# Patient Record
Sex: Female | Born: 1960 | Race: Black or African American | Hispanic: No | Smoking: Never smoker
Health system: Southern US, Community
[De-identification: ages and names within clinical notes are randomized; demographics above are authoritative.]

## PROBLEM LIST (undated history)

## (undated) DIAGNOSIS — I10 Essential (primary) hypertension: Secondary | ICD-10-CM

## (undated) DIAGNOSIS — E876 Hypokalemia: Secondary | ICD-10-CM

## (undated) DIAGNOSIS — N189 Chronic kidney disease, unspecified: Secondary | ICD-10-CM

## (undated) DIAGNOSIS — N022 Recurrent and persistent hematuria with diffuse membranous glomerulonephritis: Secondary | ICD-10-CM

## (undated) DIAGNOSIS — E119 Type 2 diabetes mellitus without complications: Secondary | ICD-10-CM

## (undated) DIAGNOSIS — R809 Proteinuria, unspecified: Secondary | ICD-10-CM

## (undated) HISTORY — PX: BREAST LUMPECTOMY: SHX2

## (undated) HISTORY — PX: UTERINE FIBROID SURGERY: SHX826

## (undated) HISTORY — PX: BREAST REDUCTION SURGERY: SHX8

## (undated) HISTORY — DX: Recurrent and persistent hematuria with diffuse membranous glomerulonephritis: N02.2

## (undated) HISTORY — PX: ABDOMINAL HYSTERECTOMY: SHX81

## (undated) HISTORY — PX: REDUCTION MAMMAPLASTY: SUR839

---

## 2005-07-12 ENCOUNTER — Inpatient Hospital Stay (HOSPITAL_COMMUNITY): Admission: RE | Admit: 2005-07-12 | Discharge: 2005-07-14 | Payer: Self-pay | Admitting: Obstetrics and Gynecology

## 2005-09-06 ENCOUNTER — Encounter: Admission: RE | Admit: 2005-09-06 | Discharge: 2005-09-06 | Payer: Self-pay | Admitting: Pediatrics

## 2010-06-02 ENCOUNTER — Encounter: Payer: 59 | Attending: Family Medicine | Admitting: *Deleted

## 2010-06-02 DIAGNOSIS — R7309 Other abnormal glucose: Secondary | ICD-10-CM | POA: Insufficient documentation

## 2010-06-02 DIAGNOSIS — Z713 Dietary counseling and surveillance: Secondary | ICD-10-CM | POA: Insufficient documentation

## 2010-06-22 ENCOUNTER — Other Ambulatory Visit (HOSPITAL_COMMUNITY): Payer: Self-pay | Admitting: Nephrology

## 2010-06-22 DIAGNOSIS — R801 Persistent proteinuria, unspecified: Secondary | ICD-10-CM

## 2010-06-22 DIAGNOSIS — R319 Hematuria, unspecified: Secondary | ICD-10-CM

## 2010-06-30 ENCOUNTER — Ambulatory Visit: Payer: 59 | Admitting: *Deleted

## 2010-07-01 ENCOUNTER — Other Ambulatory Visit: Payer: Self-pay | Admitting: Interventional Radiology

## 2010-07-01 ENCOUNTER — Ambulatory Visit (HOSPITAL_COMMUNITY)
Admission: RE | Admit: 2010-07-01 | Discharge: 2010-07-01 | Disposition: A | Discharge status: Home / Self Care | Payer: 59 | Source: Ambulatory Visit | Attending: Nephrology | Admitting: Nephrology

## 2010-07-01 DIAGNOSIS — R319 Hematuria, unspecified: Secondary | ICD-10-CM

## 2010-07-01 DIAGNOSIS — N052 Unspecified nephritic syndrome with diffuse membranous glomerulonephritis: Secondary | ICD-10-CM | POA: Insufficient documentation

## 2010-07-01 DIAGNOSIS — R801 Persistent proteinuria, unspecified: Secondary | ICD-10-CM

## 2010-07-01 LAB — PROTIME-INR
INR: 0.93 (ref 0.00–1.49)
Prothrombin Time: 12.7 seconds (ref 11.6–15.2)

## 2010-07-01 LAB — CBC
HCT: 32 % — ABNORMAL LOW (ref 36.0–46.0)
Hemoglobin: 11.4 g/dL — ABNORMAL LOW (ref 12.0–15.0)
RBC: 3.82 MIL/uL — ABNORMAL LOW (ref 3.87–5.11)
RDW: 12.4 % (ref 11.5–15.5)

## 2010-07-01 LAB — GLUCOSE, CAPILLARY: Glucose-Capillary: 85 mg/dL (ref 70–99)

## 2010-07-01 LAB — APTT: aPTT: 28 seconds (ref 24–37)

## 2010-07-16 ENCOUNTER — Other Ambulatory Visit: Payer: Self-pay | Admitting: Obstetrics and Gynecology

## 2010-07-16 DIAGNOSIS — Z1231 Encounter for screening mammogram for malignant neoplasm of breast: Secondary | ICD-10-CM

## 2010-07-20 ENCOUNTER — Ambulatory Visit
Admission: RE | Admit: 2010-07-20 | Discharge: 2010-07-20 | Disposition: A | Discharge status: Home / Self Care | Payer: 59 | Source: Ambulatory Visit | Attending: Obstetrics and Gynecology | Admitting: Obstetrics and Gynecology

## 2010-07-20 ENCOUNTER — Other Ambulatory Visit: Payer: Self-pay | Admitting: Obstetrics and Gynecology

## 2010-07-20 DIAGNOSIS — Z1231 Encounter for screening mammogram for malignant neoplasm of breast: Secondary | ICD-10-CM

## 2010-07-21 ENCOUNTER — Other Ambulatory Visit: Payer: Self-pay | Admitting: Nephrology

## 2010-07-21 ENCOUNTER — Ambulatory Visit
Admission: RE | Admit: 2010-07-21 | Discharge: 2010-07-21 | Disposition: A | Discharge status: Home / Self Care | Payer: 59 | Source: Ambulatory Visit | Attending: Nephrology | Admitting: Nephrology

## 2010-07-21 ENCOUNTER — Encounter: Payer: 59 | Attending: Family Medicine | Admitting: *Deleted

## 2010-07-21 DIAGNOSIS — N052 Unspecified nephritic syndrome with diffuse membranous glomerulonephritis: Secondary | ICD-10-CM

## 2010-07-21 DIAGNOSIS — Z713 Dietary counseling and surveillance: Secondary | ICD-10-CM | POA: Insufficient documentation

## 2010-07-21 DIAGNOSIS — R7309 Other abnormal glucose: Secondary | ICD-10-CM | POA: Insufficient documentation

## 2010-07-28 ENCOUNTER — Ambulatory Visit
Admission: RE | Admit: 2010-07-28 | Discharge: 2010-07-28 | Disposition: A | Discharge status: Home / Self Care | Payer: 59 | Source: Ambulatory Visit | Attending: Obstetrics and Gynecology | Admitting: Obstetrics and Gynecology

## 2010-07-28 ENCOUNTER — Ambulatory Visit: Payer: 59

## 2010-07-28 DIAGNOSIS — Z1231 Encounter for screening mammogram for malignant neoplasm of breast: Secondary | ICD-10-CM

## 2010-07-30 ENCOUNTER — Other Ambulatory Visit: Payer: Self-pay | Admitting: Obstetrics and Gynecology

## 2010-07-30 DIAGNOSIS — R928 Other abnormal and inconclusive findings on diagnostic imaging of breast: Secondary | ICD-10-CM

## 2010-08-06 ENCOUNTER — Ambulatory Visit
Admission: RE | Admit: 2010-08-06 | Discharge: 2010-08-06 | Disposition: A | Discharge status: Home / Self Care | Payer: 59 | Source: Ambulatory Visit | Attending: Obstetrics and Gynecology | Admitting: Obstetrics and Gynecology

## 2010-08-06 DIAGNOSIS — R928 Other abnormal and inconclusive findings on diagnostic imaging of breast: Secondary | ICD-10-CM

## 2010-08-09 ENCOUNTER — Other Ambulatory Visit: Payer: Self-pay | Admitting: Obstetrics and Gynecology

## 2010-08-19 ENCOUNTER — Ambulatory Visit: Payer: 59 | Admitting: *Deleted

## 2010-08-31 ENCOUNTER — Ambulatory Visit: Payer: 59 | Admitting: *Deleted

## 2010-12-29 ENCOUNTER — Ambulatory Visit
Admission: RE | Admit: 2010-12-29 | Discharge: 2010-12-29 | Disposition: A | Discharge status: Home / Self Care | Payer: 59 | Source: Ambulatory Visit | Attending: Nephrology | Admitting: Nephrology

## 2010-12-29 ENCOUNTER — Other Ambulatory Visit: Payer: Self-pay | Admitting: Nephrology

## 2010-12-29 DIAGNOSIS — M7989 Other specified soft tissue disorders: Secondary | ICD-10-CM

## 2011-03-02 ENCOUNTER — Other Ambulatory Visit (HOSPITAL_COMMUNITY): Payer: Self-pay | Admitting: *Deleted

## 2011-03-02 ENCOUNTER — Encounter (HOSPITAL_COMMUNITY)
Admission: RE | Admit: 2011-03-02 | Discharge: 2011-03-02 | Disposition: A | Discharge status: Home / Self Care | Payer: 59 | Source: Ambulatory Visit | Attending: Nephrology | Admitting: Nephrology

## 2011-03-02 DIAGNOSIS — N052 Unspecified nephritic syndrome with diffuse membranous glomerulonephritis: Secondary | ICD-10-CM | POA: Insufficient documentation

## 2011-03-02 MED ORDER — SODIUM CHLORIDE 0.9 % IV SOLN
500.0000 mg | Freq: Every day | INTRAVENOUS | Status: DC
  Administered 2011-03-02: 500 mg via INTRAVENOUS
  Filled 2011-03-02: qty 4

## 2011-03-03 ENCOUNTER — Encounter (HOSPITAL_COMMUNITY)
Admission: RE | Admit: 2011-03-03 | Discharge: 2011-03-03 | Disposition: A | Discharge status: Home / Self Care | Payer: 59 | Source: Ambulatory Visit | Attending: Nephrology | Admitting: Nephrology

## 2011-03-03 MED ORDER — SODIUM CHLORIDE 0.9 % IV SOLN
500.0000 mg | INTRAVENOUS | Status: AC
  Administered 2011-03-03: 500 mg via INTRAVENOUS
  Filled 2011-03-03: qty 4

## 2011-03-03 MED ORDER — METHYLPREDNISOLONE SODIUM SUCC 125 MG IJ SOLR: 500.0000 mg | Freq: Once | INTRAMUSCULAR | Status: DC

## 2011-03-03 MED ORDER — METHYLPREDNISOLONE SODIUM SUCC 125 MG IJ SOLR
500.0000 mg | INTRAMUSCULAR | Status: DC
  Filled 2011-03-03: qty 8

## 2011-04-14 ENCOUNTER — Other Ambulatory Visit: Payer: Self-pay | Admitting: Nephrology

## 2011-04-14 DIAGNOSIS — IMO0002 Reserved for concepts with insufficient information to code with codable children: Secondary | ICD-10-CM

## 2011-04-25 ENCOUNTER — Other Ambulatory Visit (HOSPITAL_COMMUNITY): Payer: Self-pay | Admitting: *Deleted

## 2011-04-26 ENCOUNTER — Ambulatory Visit
Admission: RE | Admit: 2011-04-26 | Discharge: 2011-04-26 | Disposition: A | Discharge status: Home / Self Care | Payer: 59 | Source: Ambulatory Visit | Attending: Nephrology | Admitting: Nephrology

## 2011-04-26 DIAGNOSIS — IMO0002 Reserved for concepts with insufficient information to code with codable children: Secondary | ICD-10-CM

## 2011-05-02 ENCOUNTER — Encounter (HOSPITAL_COMMUNITY)
Admission: RE | Admit: 2011-05-02 | Discharge: 2011-05-02 | Disposition: A | Discharge status: Home / Self Care | Payer: 59 | Source: Ambulatory Visit | Attending: Nephrology | Admitting: Nephrology

## 2011-05-02 DIAGNOSIS — N052 Unspecified nephritic syndrome with diffuse membranous glomerulonephritis: Secondary | ICD-10-CM | POA: Insufficient documentation

## 2011-05-02 MED ORDER — SODIUM CHLORIDE 0.9 % IV SOLN
500.0000 mg | INTRAVENOUS | Status: DC
  Administered 2011-05-02: 500 mg via INTRAVENOUS
  Filled 2011-05-02: qty 4

## 2011-05-02 MED ORDER — SODIUM CHLORIDE 0.9 % IV SOLN
INTRAVENOUS | Status: DC
  Administered 2011-05-02: 13:00:00 via INTRAVENOUS

## 2011-05-03 ENCOUNTER — Encounter (HOSPITAL_COMMUNITY)
Admission: RE | Admit: 2011-05-03 | Discharge: 2011-05-03 | Disposition: A | Discharge status: Home / Self Care | Payer: 59 | Source: Ambulatory Visit | Attending: Nephrology | Admitting: Nephrology

## 2011-05-03 MED ORDER — SODIUM CHLORIDE 0.9 % IV SOLN
500.0000 mg | INTRAVENOUS | Status: AC
  Administered 2011-05-03: 500 mg via INTRAVENOUS
  Filled 2011-05-03: qty 4

## 2011-05-03 MED ORDER — SODIUM CHLORIDE 0.9 % IV SOLN
INTRAVENOUS | Status: AC
  Administered 2011-05-03: 13:00:00 via INTRAVENOUS

## 2011-06-29 ENCOUNTER — Other Ambulatory Visit (HOSPITAL_COMMUNITY): Payer: Self-pay

## 2011-06-29 ENCOUNTER — Encounter (HOSPITAL_COMMUNITY): Payer: 59

## 2011-06-30 ENCOUNTER — Encounter (HOSPITAL_COMMUNITY)
Admission: RE | Admit: 2011-06-30 | Discharge: 2011-06-30 | Disposition: A | Discharge status: Home / Self Care | Payer: 59 | Source: Ambulatory Visit | Attending: Nephrology | Admitting: Nephrology

## 2011-06-30 DIAGNOSIS — N052 Unspecified nephritic syndrome with diffuse membranous glomerulonephritis: Secondary | ICD-10-CM | POA: Insufficient documentation

## 2011-06-30 MED ORDER — METHYLPREDNISOLONE SODIUM SUCC 125 MG IJ SOLR
500.0000 mg | INTRAMUSCULAR | Status: DC
  Filled 2011-06-30: qty 8

## 2011-06-30 MED ORDER — SODIUM CHLORIDE 0.9 % IV SOLN: 500.0000 mg | INTRAVENOUS | Status: DC

## 2011-06-30 MED ORDER — SODIUM CHLORIDE 0.9 % IV SOLN
500.0000 mg | INTRAVENOUS | Status: DC
  Administered 2011-06-30: 500 mg via INTRAVENOUS
  Filled 2011-06-30: qty 4

## 2011-06-30 MED ORDER — SODIUM CHLORIDE 0.9 % IV SOLN
INTRAVENOUS | Status: DC
  Administered 2011-06-30: 250 mL via INTRAVENOUS

## 2011-07-01 ENCOUNTER — Encounter (HOSPITAL_COMMUNITY)
Admission: RE | Admit: 2011-07-01 | Discharge: 2011-07-01 | Disposition: A | Discharge status: Home / Self Care | Payer: 59 | Source: Ambulatory Visit | Attending: Nephrology | Admitting: Nephrology

## 2011-07-01 MED ORDER — SODIUM CHLORIDE 0.9 % IV SOLN
INTRAVENOUS | Status: AC
  Administered 2011-07-01: 09:00:00 via INTRAVENOUS

## 2011-07-01 MED ORDER — SODIUM CHLORIDE 0.9 % IV SOLN
500.0000 mg | Freq: Once | INTRAVENOUS | Status: AC
  Administered 2011-07-01: 500 mg via INTRAVENOUS
  Filled 2011-07-01: qty 4

## 2012-01-25 IMAGING — CR DG CHEST 2V
2 series · 2 of 2 positions shown · non-contrast
Comparison: None.

CLINICAL DATA: Membranous nephropathy,  evaluate for mass or
malignancy

CHEST - 2 VIEW

[w chest pa]
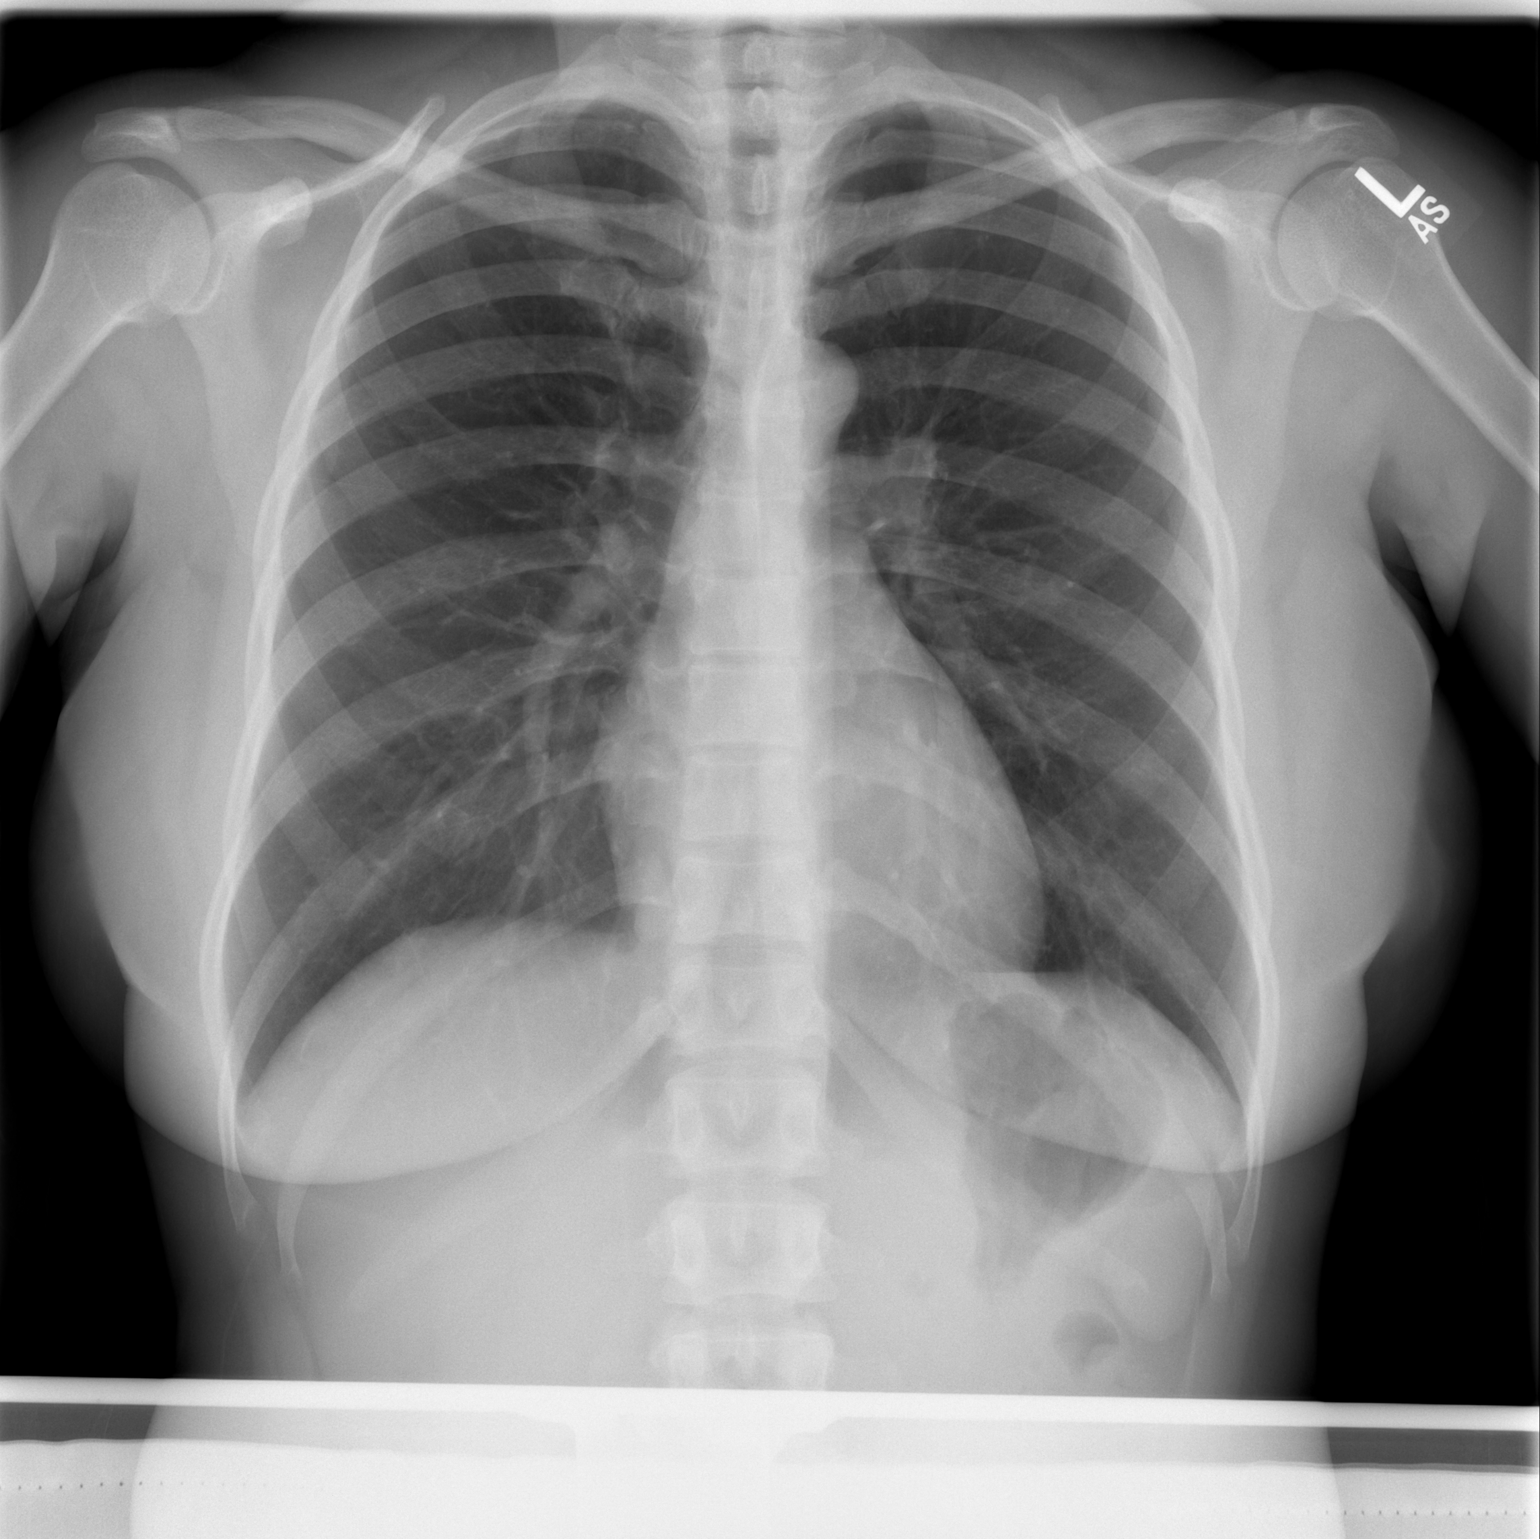

[w chest lat]
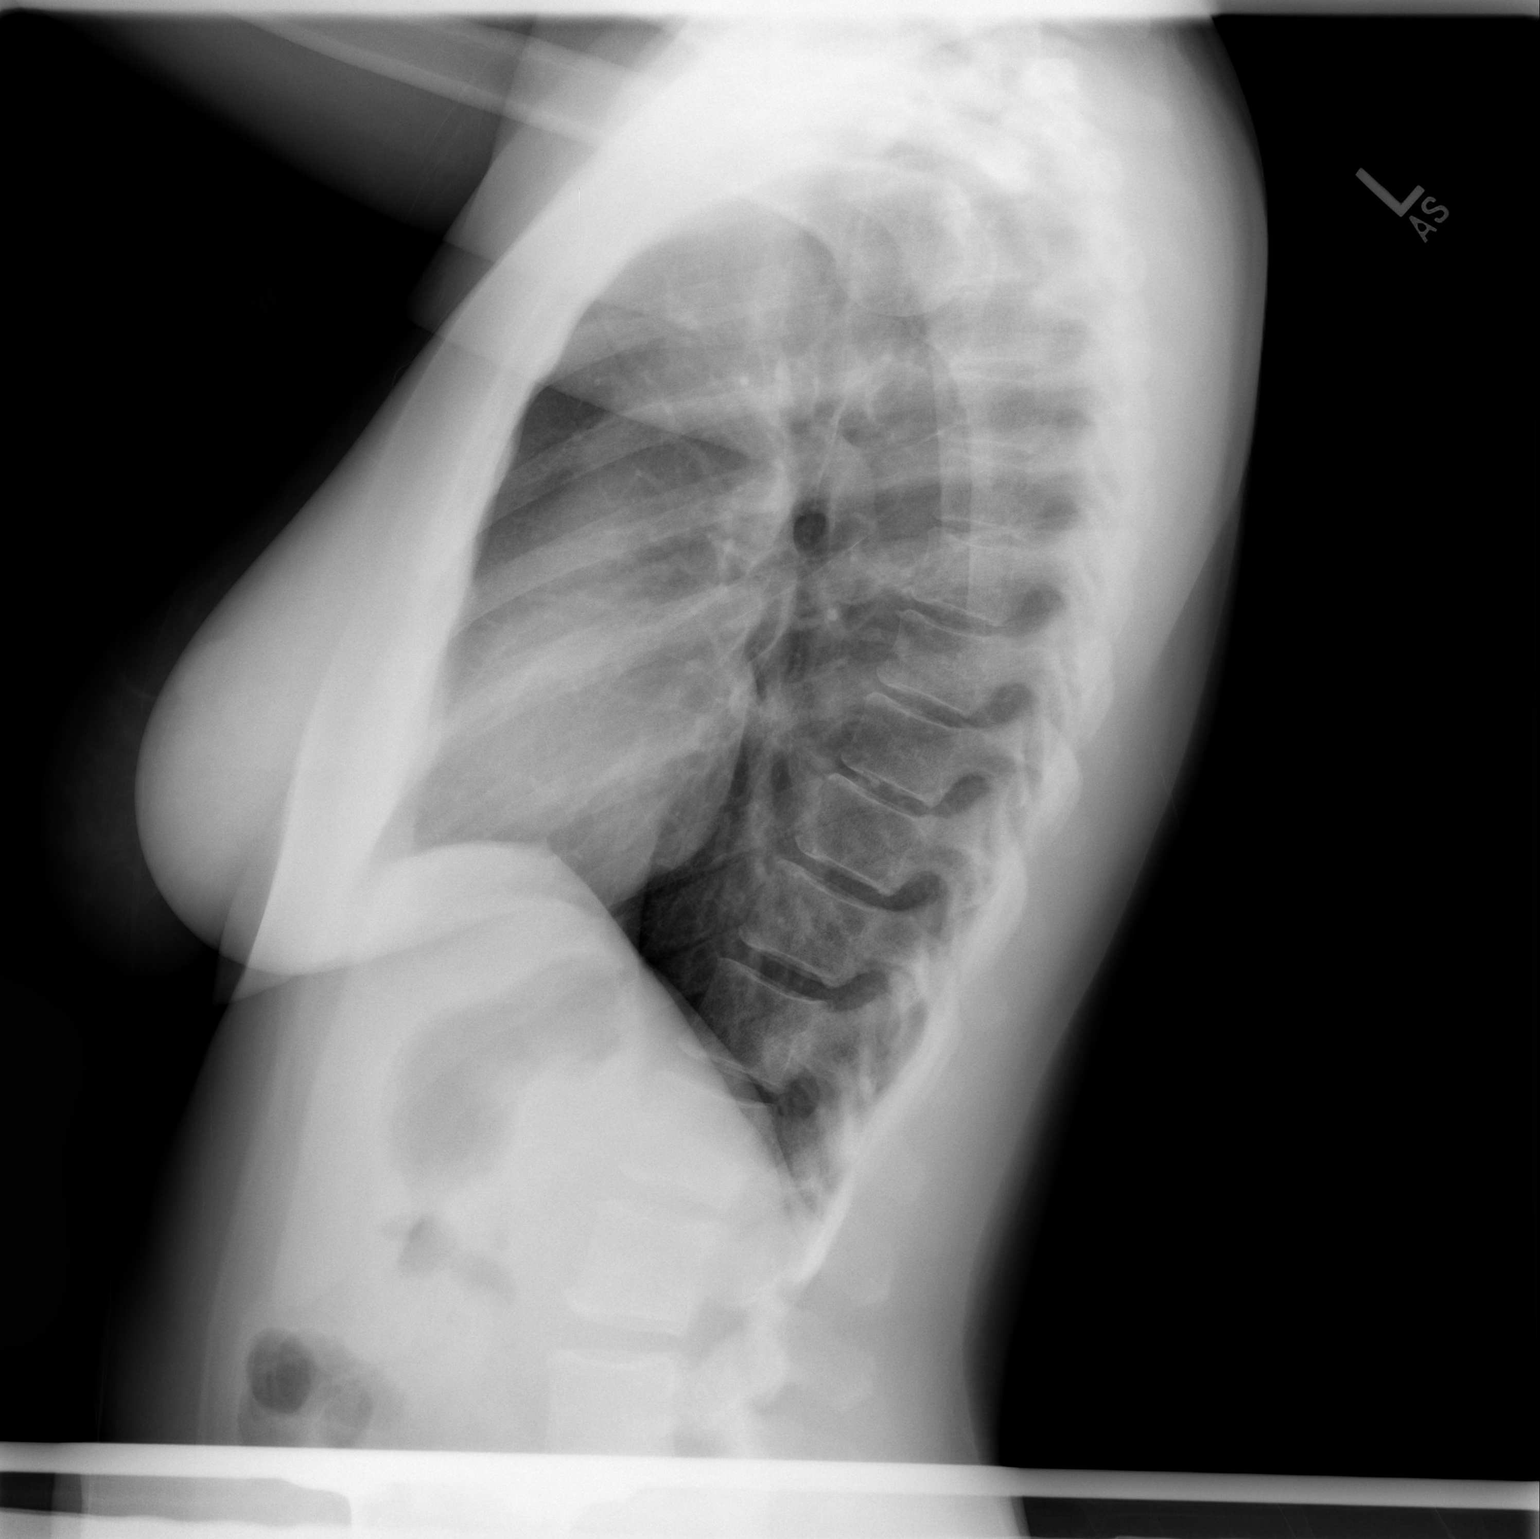

[2 of 2 positions shown; findings below may reference images not displayed]

FINDINGS: Normal mediastinum and heart silhouette.  Costophrenic
angles are clear.  No effusion, infiltrate, or pneumothorax.
IMPRESSION: Normal chest radiograph.

## 2012-02-10 IMAGING — MG MM DIAGNOSTIC LTD LEFT
3 series · 3 of 3 positions shown · non-contrast
Comparison: With prior mammograms dated 07/28/2010, 08/26/2009 and
03/04/2008

CLINICAL DATA: Abnormal left screening mammogram.  History of
reduction mammoplasty.

DIGITAL DIAGNOSTIC LEFT MAMMOGRAM

[L CC]
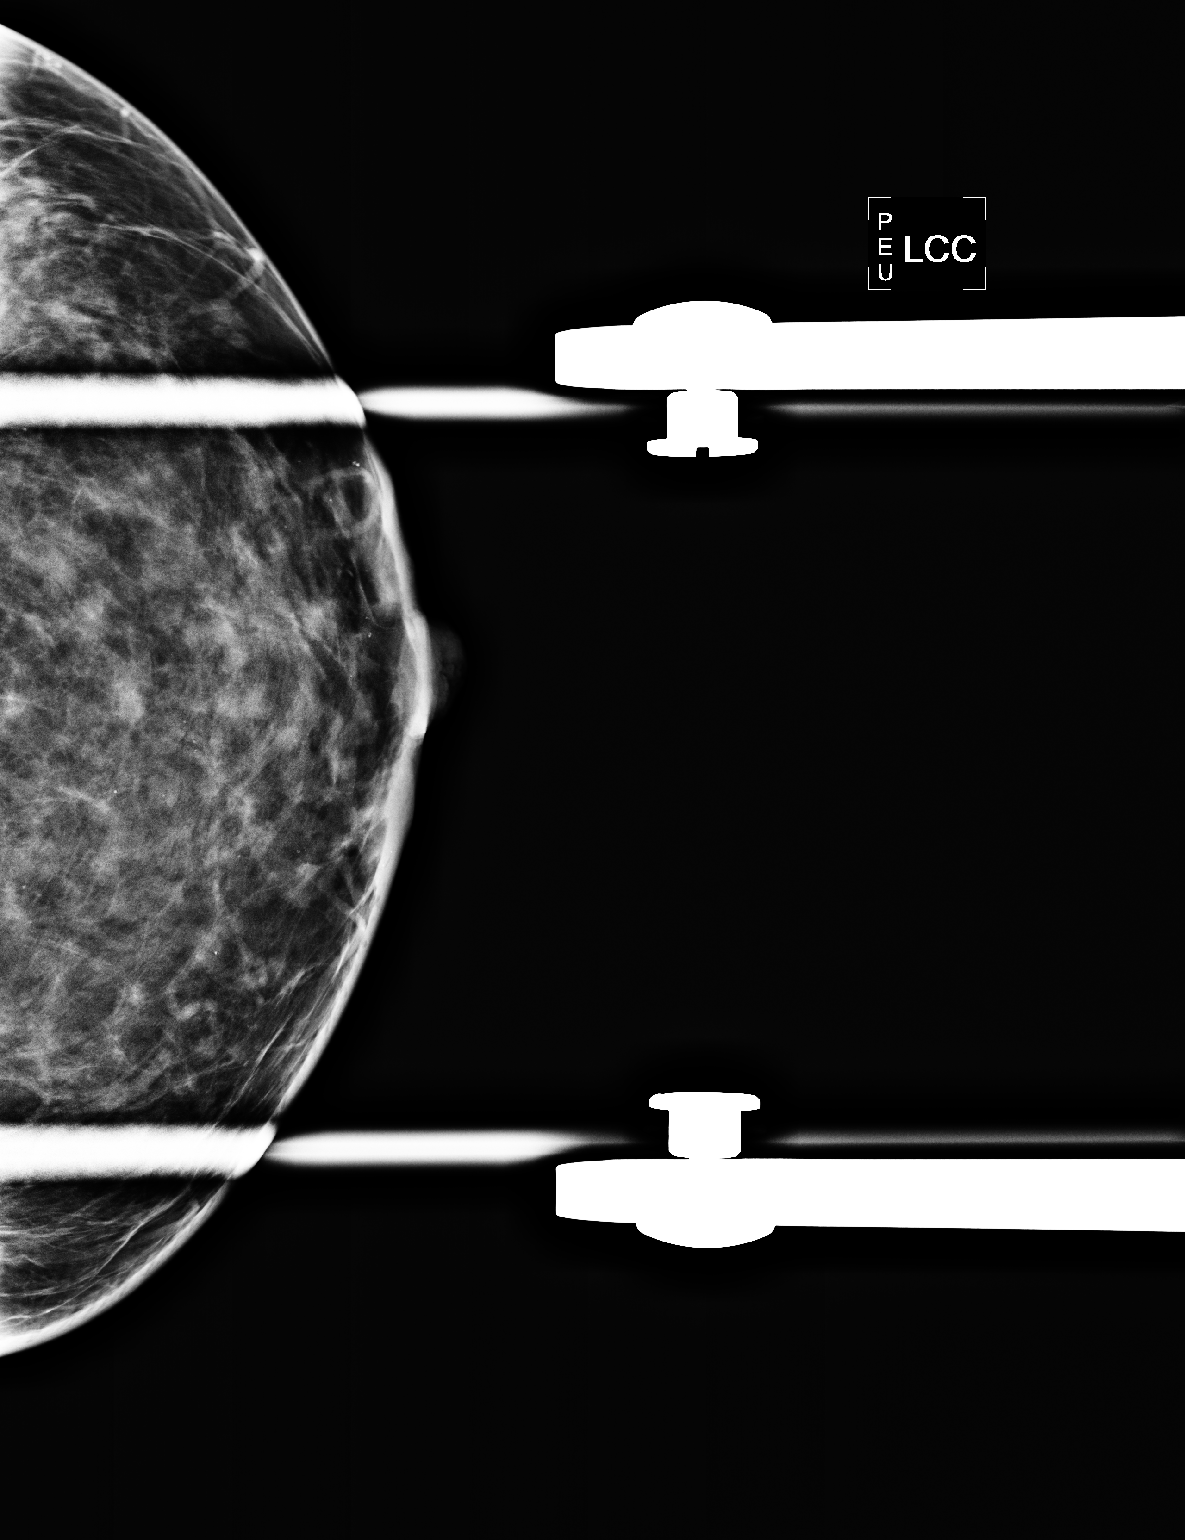

[L MLO (1 of 2)]
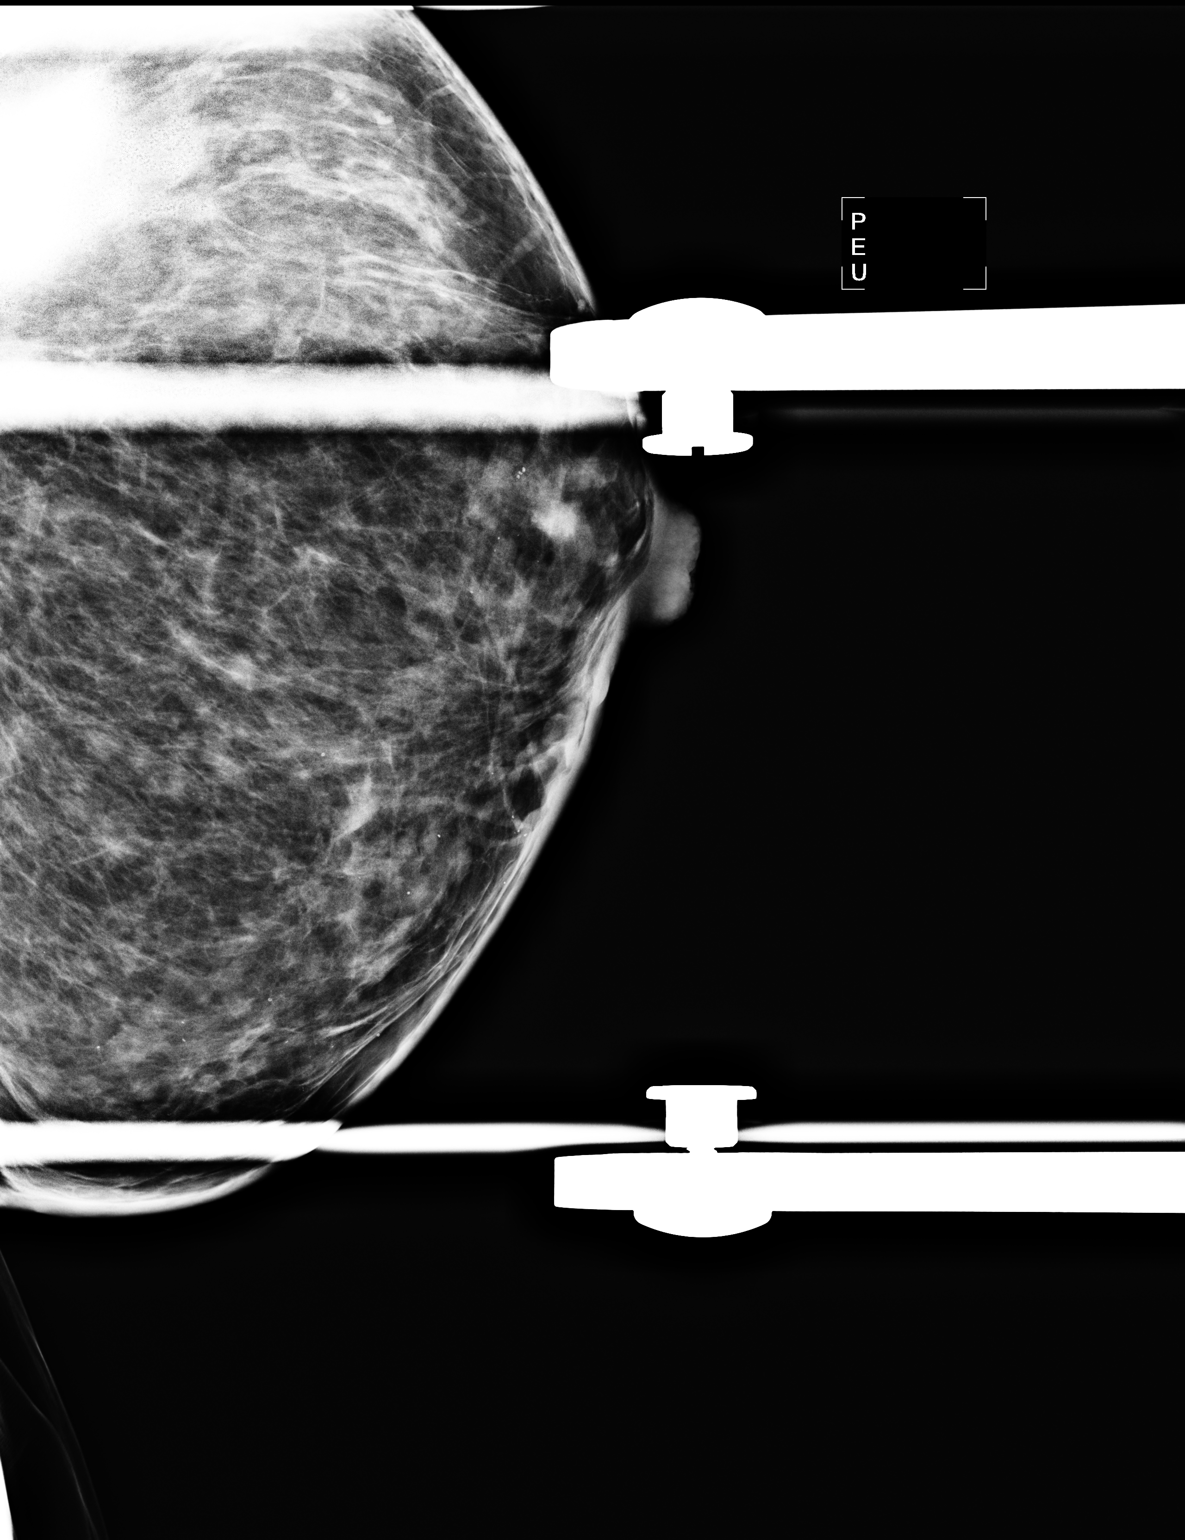

[L MLO (2 of 2)]
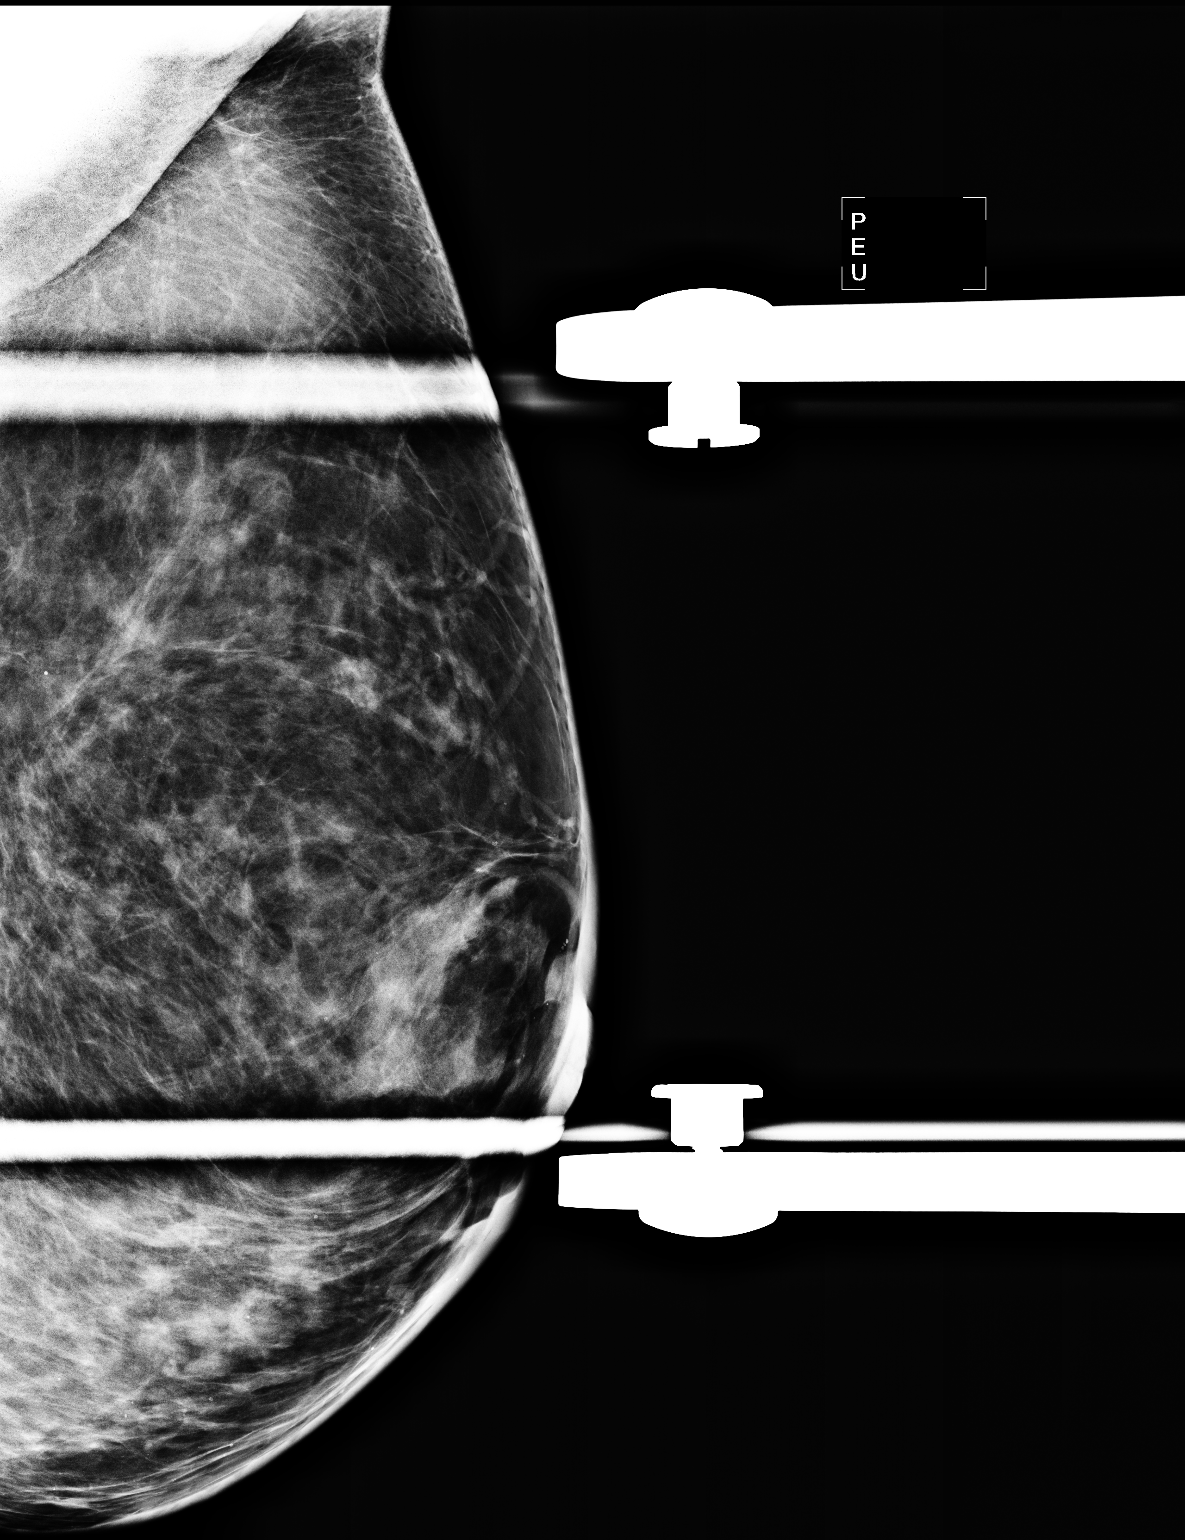

[3 of 3 positions shown; findings below may reference images not displayed]

FINDINGS: Persist asymmetric tissue is seen in the left subareolar
region.  It is unchanged from the prior exams.

On physical exam, I do not palpate a mass in the left breast.

Ultrasound is performed, showing normal tissue is seen in the left
subareolar region.
IMPRESSION: No evidence of malignancy in the left breast.  Screening mammogram
in 1 year is recommended.

BI-RADS CATEGORY 1:  Negative.

## 2012-08-03 ENCOUNTER — Telehealth: Payer: Self-pay | Admitting: Internal Medicine

## 2012-08-03 NOTE — Telephone Encounter (Signed)
C/D 08/03/12 for appt. 09/05/12

## 2012-08-03 NOTE — Telephone Encounter (Signed)
S/W PT IN RE NP APPT 07/09 @ 1:30 W/DR. MOHAMED REFERRING  DR. Gerlene Burdock FOX DX- ANEMIA WELCOME PACKET MAILED.

## 2012-08-17 ENCOUNTER — Telehealth: Payer: Self-pay | Admitting: *Deleted

## 2012-08-17 NOTE — Telephone Encounter (Signed)
Progress noted from [facility] given to Dr Donnald Garre to review.  Pt will see Dr Donnald Garre at new patient on 09/05/12.

## 2012-09-04 ENCOUNTER — Telehealth: Payer: Self-pay | Admitting: Internal Medicine

## 2012-09-04 NOTE — Telephone Encounter (Signed)
Pt called to r/s her np appt. To 09/19/12@1 :30

## 2012-09-05 ENCOUNTER — Other Ambulatory Visit: Payer: 59 | Admitting: Lab

## 2012-09-05 ENCOUNTER — Ambulatory Visit: Payer: 59 | Admitting: Internal Medicine

## 2012-09-05 ENCOUNTER — Ambulatory Visit: Payer: 59

## 2012-09-19 ENCOUNTER — Telehealth: Payer: Self-pay | Admitting: Internal Medicine

## 2012-09-19 ENCOUNTER — Ambulatory Visit: Payer: 59

## 2012-09-19 ENCOUNTER — Ambulatory Visit: Payer: 59 | Admitting: Internal Medicine

## 2012-09-19 ENCOUNTER — Other Ambulatory Visit: Payer: 59 | Admitting: Lab

## 2012-09-19 DIAGNOSIS — D539 Nutritional anemia, unspecified: Secondary | ICD-10-CM | POA: Insufficient documentation

## 2012-09-19 NOTE — Telephone Encounter (Signed)
Pt called to r/s np appt for 07/23 due to work 08/12 @ 1:30. New calendar mailed.

## 2012-09-20 NOTE — Progress Notes (Signed)
This encounter was created in error - please disregard.

## 2012-10-03 ENCOUNTER — Encounter: Payer: Self-pay | Admitting: Nephrology

## 2012-10-09 ENCOUNTER — Other Ambulatory Visit (HOSPITAL_BASED_OUTPATIENT_CLINIC_OR_DEPARTMENT_OTHER): Payer: 59 | Admitting: Lab

## 2012-10-09 ENCOUNTER — Ambulatory Visit: Payer: 59

## 2012-10-09 ENCOUNTER — Encounter: Payer: Self-pay | Admitting: Internal Medicine

## 2012-10-09 ENCOUNTER — Telehealth: Payer: Self-pay | Admitting: Internal Medicine

## 2012-10-09 ENCOUNTER — Ambulatory Visit (HOSPITAL_BASED_OUTPATIENT_CLINIC_OR_DEPARTMENT_OTHER): Payer: 59 | Admitting: Internal Medicine

## 2012-10-09 VITALS — BP 127/88 | HR 92 | Temp 98.1°F | Resp 19 | Ht 64.0 in | Wt 155.0 lb

## 2012-10-09 DIAGNOSIS — D649 Anemia, unspecified: Secondary | ICD-10-CM

## 2012-10-09 DIAGNOSIS — N052 Unspecified nephritic syndrome with diffuse membranous glomerulonephritis: Secondary | ICD-10-CM

## 2012-10-09 DIAGNOSIS — I10 Essential (primary) hypertension: Secondary | ICD-10-CM

## 2012-10-09 DIAGNOSIS — D539 Nutritional anemia, unspecified: Secondary | ICD-10-CM

## 2012-10-09 LAB — CBC & DIFF AND RETIC
BASO%: 0.3 % (ref 0.0–2.0)
Eosinophils Absolute: 0.1 10*3/uL (ref 0.0–0.5)
MONO#: 0.3 10*3/uL (ref 0.1–0.9)
NEUT#: 3.6 10*3/uL (ref 1.5–6.5)
RBC: 3.13 10*6/uL — ABNORMAL LOW (ref 3.70–5.45)
RDW: 13.6 % (ref 11.2–14.5)
Retic %: 1.32 % (ref 0.70–2.10)
Retic Ct Abs: 41.32 10*3/uL (ref 33.70–90.70)
WBC: 6.2 10*3/uL (ref 3.9–10.3)
lymph#: 2.2 10*3/uL (ref 0.9–3.3)

## 2012-10-09 LAB — IRON AND TIBC CHCC: %SAT: 30 % (ref 21–57)

## 2012-10-09 NOTE — Progress Notes (Signed)
Checked in new pt with no financial concerns. °

## 2012-10-09 NOTE — Patient Instructions (Signed)
You have persistent anemia of unknown etiology. I ordered several studies today to evaluate your anemia. Followup visit in 3 weeks

## 2012-10-09 NOTE — Telephone Encounter (Signed)
s.w. pt and advised that Dr. Kerry Fort wanted lab b4 visit...ok and awre

## 2012-10-09 NOTE — Progress Notes (Signed)
[facility] [phone]   [phone]  CONSULT NOTE  REFERRING PHYSICIAN: Dr. Marina Gravel  REASON FOR CONSULTATION:  52 years old African American female with persistent anemia  HPI Susan Haley is a 52 y.o. 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 The patient mentions that she has anemia for many years. It was initially thought to be related to menorrhagia but the patient had hysterectomy in 2008 with no improvement in her anemia . On May 31st 2012 her hemoglobin was 11.4. 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 She was started on treatment with Cytoxan and prednisone in December of 2012 until June of 2013. Her hemoglobin at that time was in the range of 9.0-9.5 g/dL. She was seen recently by Dr. Caryn Section and repeat CBC in June of 2014 showed hemoglobin of 9.1. Iron study at that time showed serum iron of 68 with total iron binding capacity of 216 and iron saturation 26%. The patient was started on treatment with Ferrex Plus 150 mg by mouth daily but she does not take it on regular basis. She is feeling fine today with no specific complaints except for mild fatigue and occasional nosebleed. The patient denied having any rectal bleeding. She never had a colonoscopy before and was not tested for Hemoccult. She has no significant family history of sickle cell disease. The patient denied having any significant chest pain, shortness breath, cough or hemoptysis. She has no weight loss but has night sweats.  PAST MEDICAL HISTORY: Hypertension, borderline hypercholesterolemia, membranous nephropathy, anemia and status post hysterectomy in 2008.  FAMILY HISTORY: Significant for her mother with anemia, father with prostate cancer and maternal aunt with breast cancer.  SOCIAL HISTORY: The patient is single and  has 2 children. She works as a Scientist, forensic. She has no history of smoking, alcohol or drug abuse.   Allergies  Allergen Reactions  . Ibuprofen Swelling    Current Outpatient Prescriptions  Medication Sig Dispense Refill  . diltiazem (TIAZAC) 300 MG 24 hr capsule Take 300 mg by mouth daily.      . furosemide (LASIX) 40 MG tablet Take 40 mg by mouth daily as needed.      . iron polysaccharides (NIFEREX) 150 MG capsule Take 150 mg by mouth daily.      Marland Kitchen lisinopril-hydrochlorothiazide (PRINZIDE,ZESTORETIC) 20-12.5 MG per tablet Take 2 tablets by mouth daily.      . potassium chloride SA (K-DUR,KLOR-CON) 20 MEQ tablet Take 20 mEq by mouth 2 (two) times daily.       No current facility-administered medications for this visit.    Review of Systems  A comprehensive review of systems was negative.  Physical Exam  JXB:JYNWG, healthy, no distress, well nourished and well developed SKIN: skin color, texture, turgor are normal HEAD: Normocephalic, No masses, lesions, tenderness or abnormalities EYES: normal, PERRLA EARS: External ears normal OROPHARYNX:no exudate and no erythema  NECK: supple, no adenopathy LYMPH:  no palpable lymphadenopathy, no hepatosplenomegaly BREAST:not examined LUNGS: clear to auscultation  HEART: regular rate & rhythm and no murmurs ABDOMEN:abdomen soft, non-tender, normal bowel sounds and no masses or organomegaly BACK: Back symmetric, no curvature. EXTREMITIES:no joint deformities, effusion, or inflammation, no edema, no skin discoloration  NEURO: alert & oriented x 3 with fluent speech, no focal motor/sensory deficits  PERFORMANCE STATUS: ECOG 1  LABORATORY DATA: Lab Results  Component Value  Date   WBC 6.2 10/09/2012   HGB 9.4* 10/09/2012   HCT 26.8* 10/09/2012   MCV 85.6 10/09/2012   PLT 189 10/09/2012      Chemistry   No results found for this basename: NA, K, CL, CO2, BUN, CREATININE, GLU   No results found for this basename: CALCIUM, ALKPHOS,  AST, ALT, BILITOT       RADIOGRAPHIC STUDIES: No results found.  ASSESSMENT: This is a very pleasant 52 years old [place] American female with persistent anemia for more than 25 years of unclear etiology at this point. This could be secondary to nutritional deficiency versus anemia of chronic disease versus myelodysplasia.   PLAN: I ordered several studies today to evaluate her anemia including repeat CBC, comprehensive metabolic panel, LDH, serum erythropoietin, serum protein electrophoreses, iron study, ferritin, serum folate and serum vitamin B12 level. I also gave the patient stool cards for Hemoccult. I would see the patient back for followup visit in 3 weeks for evaluation and discussion of her pending labs results and further recommendation regarding her anemia. For now she will continue on the oral iron tablets. She was advised to call immediately if she has any concerning symptoms in the interval. The patient voices understanding of current disease status and treatment options and is in agreement with the current care plan.  All questions were answered. The patient knows to call the clinic with any problems, questions or concerns. We can certainly see the patient much sooner if necessary.  Thank you so much for allowing me to participate in the care of Susan Haley. I will continue to follow up the patient with you and assist in her care.  I spent 35 minutes counseling the patient face to face. The total time spent in the appointment was 50 minutes.  Toinette Lackie K. 10/09/2012, 3:25 PM

## 2012-10-11 LAB — ERYTHROPOIETIN: Erythropoietin: 10.7 m[IU]/mL (ref 2.6–18.5)

## 2012-10-11 LAB — PROTEIN ELECTROPHORESIS, SERUM
Albumin ELP: 57.8 % (ref 55.8–66.1)
Alpha-2-Globulin: 8.7 % (ref 7.1–11.8)
Beta Globulin: 5.6 % (ref 4.7–7.2)
Total Protein, Serum Electrophoresis: 7.8 g/dL (ref 6.0–8.3)

## 2012-10-11 LAB — FOLATE: Folate: >20 ng/mL

## 2012-10-30 ENCOUNTER — Ambulatory Visit (HOSPITAL_BASED_OUTPATIENT_CLINIC_OR_DEPARTMENT_OTHER): Payer: 59 | Admitting: Internal Medicine

## 2012-10-30 ENCOUNTER — Other Ambulatory Visit (HOSPITAL_BASED_OUTPATIENT_CLINIC_OR_DEPARTMENT_OTHER): Payer: 59 | Admitting: Lab

## 2012-10-30 ENCOUNTER — Encounter: Payer: Self-pay | Admitting: Internal Medicine

## 2012-10-30 VITALS — BP 127/81 | HR 80 | Temp 98.7°F | Resp 18 | Ht 64.0 in | Wt 153.9 lb

## 2012-10-30 DIAGNOSIS — D539 Nutritional anemia, unspecified: Secondary | ICD-10-CM

## 2012-10-30 DIAGNOSIS — D649 Anemia, unspecified: Secondary | ICD-10-CM

## 2012-10-30 LAB — CBC WITH DIFFERENTIAL/PLATELET
Basophils Absolute: 0 10*3/uL (ref 0.0–0.1)
Eosinophils Absolute: 0.1 10*3/uL (ref 0.0–0.5)
HGB: 9.1 g/dL — ABNORMAL LOW (ref 11.6–15.9)
MCV: 87.3 fL (ref 79.5–101.0)
MONO#: 0.4 10*3/uL (ref 0.1–0.9)
MONO%: 6.2 % (ref 0.0–14.0)
NEUT#: 3.9 10*3/uL (ref 1.5–6.5)
RBC: 3.01 10*6/uL — ABNORMAL LOW (ref 3.70–5.45)
RDW: 14 % (ref 11.2–14.5)
WBC: 7 10*3/uL (ref 3.9–10.3)

## 2012-10-30 NOTE — Patient Instructions (Signed)
Followup visit in 3 months with repeat iron study

## 2012-10-30 NOTE — Progress Notes (Signed)
[facility] [phone]   [phone]  OFFICE PROGRESS NOTE  Zada Girt, MD [address] [facility] [place] [address]  DIAGNOSIS: Persistent anemia questionable for anemia of chronic disease/plus minus deficiency  PRIOR THERAPY: None  CURRENT THERAPY: Ferrex plus 150 mg by mouth daily.  INTERVAL HISTORY: Susan Haley 52 y.o. female returns to the clinic today for followup visit. The patient is feeling fine today with no specific complaints. She denied having any significant fatigue or weakness. She denied having any dizzy spells. She has no significant change since her last visit. The patient had several studies for evaluation of her anemia including iron study and ferritin that was unremarkable. The patient also has normal serum folate and slightly elevated vitamin B12 level in addition to normal serum protein electrophoreses and normal erythropoietin level. She was supposed to do stool cards for Hemoccult but she forgot. She had repeat CBC performed earlier today and she is here for evaluation and discussion of her lab results.  MEDICAL HISTORY:History reviewed. No pertinent past medical history.  ALLERGIES:  is allergic to ibuprofen.  MEDICATIONS:  Current Outpatient Prescriptions  Medication Sig Dispense Refill  . diltiazem (TIAZAC) 300 MG 24 hr capsule Take 300 mg by mouth daily.      . furosemide (LASIX) 40 MG tablet Take 40 mg by mouth daily as needed.      . iron polysaccharides (NIFEREX) 150 MG capsule Take 150 mg by mouth daily.      Marland Kitchen lisinopril-hydrochlorothiazide (PRINZIDE,ZESTORETIC) 20-12.5 MG per tablet Take 2 tablets by mouth daily.      . potassium chloride SA (K-DUR,KLOR-CON) 20 MEQ tablet Take 20 mEq by mouth 2 (two) times daily.       No current facility-administered medications for this visit.    REVIEW OF SYSTEMS:  A comprehensive review of systems was negative.   PHYSICAL EXAMINATION: General  appearance: alert, cooperative and no distress Head: Normocephalic, without obvious abnormality, atraumatic Neck: no adenopathy Lymph nodes: Cervical, supraclavicular, and axillary nodes normal. Resp: clear to auscultation bilaterally Cardio: regular rate and rhythm, S1, S2 normal, no murmur, click, rub or gallop GI: soft, non-tender; bowel sounds normal; no masses,  no organomegaly Extremities: extremities normal, atraumatic, no cyanosis or edema  ECOG PERFORMANCE STATUS: 1 - Symptomatic but completely ambulatory  Blood pressure 127/81, pulse 80, temperature 98.7 F (37.1 C), temperature source Oral, resp. rate 18, height 5\' 4"  (1.626 m), weight 153 lb 14.4 oz (69.809 kg), SpO2 100.00%.  LABORATORY DATA: Lab Results  Component Value Date   WBC 7.0 10/30/2012   HGB 9.1* 10/30/2012   HCT 26.3* 10/30/2012   MCV 87.3 10/30/2012   PLT 178 10/30/2012      Chemistry   No results found for this basename: NA, K, CL, CO2, BUN, CREATININE, GLU   No results found for this basename: CALCIUM, ALKPHOS, AST, ALT, BILITOT       RADIOGRAPHIC STUDIES: No results found.  ASSESSMENT AND PLAN: This is a very pleasant 53 years old African American female with persistent anemia questionable for anemia of chronic disease plus minus time deficiency. She is currently on Ferrex 150 mg by mouth daily and tolerating it fairly well. The patient has no significant change in her hemoglobin and hematocrit. She has no significant abnormality on the previous bloodwork. I recommend for the patient to continue on her current tablets. I would see her back for followup visit in 3 months with repeat  CBC and iron study. I also advised her to repeat the stool cards for Hemoccult. She continues to have persistent anemia, I may consider the patient for a bone marrow biopsy and aspirate to rule out any other myelodysplastic process. She was advised to call immediately she has any concerning symptoms in the interval. The patient  voices understanding of current disease status and treatment options and is in agreement with the current care plan.  All questions were answered. The patient knows to call the clinic with any problems, questions or concerns. We can certainly see the patient much sooner if necessary.

## 2012-10-31 ENCOUNTER — Telehealth: Payer: Self-pay | Admitting: Internal Medicine

## 2012-10-31 NOTE — Telephone Encounter (Signed)
lvm for pt regarding to 12.2 and 12.3 appts.Marland KitchenMarland Kitchen

## 2013-01-29 ENCOUNTER — Other Ambulatory Visit: Payer: 59 | Admitting: Lab

## 2013-01-30 ENCOUNTER — Ambulatory Visit: Payer: 59 | Admitting: Physician Assistant

## 2013-07-18 ENCOUNTER — Emergency Department (HOSPITAL_COMMUNITY)
Admission: EM | Admit: 2013-07-18 | Discharge: 2013-07-18 | Disposition: A | Discharge status: Home / Self Care | Payer: 59 | Attending: Emergency Medicine | Admitting: Emergency Medicine

## 2013-07-18 ENCOUNTER — Encounter (HOSPITAL_COMMUNITY): Payer: Self-pay | Admitting: Emergency Medicine

## 2013-07-18 DIAGNOSIS — E876 Hypokalemia: Secondary | ICD-10-CM | POA: Insufficient documentation

## 2013-07-18 DIAGNOSIS — M79609 Pain in unspecified limb: Secondary | ICD-10-CM | POA: Insufficient documentation

## 2013-07-18 DIAGNOSIS — M79606 Pain in leg, unspecified: Secondary | ICD-10-CM

## 2013-07-18 DIAGNOSIS — Z79899 Other long term (current) drug therapy: Secondary | ICD-10-CM | POA: Insufficient documentation

## 2013-07-18 DIAGNOSIS — I10 Essential (primary) hypertension: Secondary | ICD-10-CM | POA: Insufficient documentation

## 2013-07-18 HISTORY — DX: Proteinuria, unspecified: R80.9

## 2013-07-18 HISTORY — DX: Hypokalemia: E87.6

## 2013-07-18 HISTORY — DX: Essential (primary) hypertension: I10

## 2013-07-18 LAB — BASIC METABOLIC PANEL
BUN: 13 mg/dL (ref 6–23)
CALCIUM: 10.8 mg/dL — AB (ref 8.4–10.5)
CO2: 23 mEq/L (ref 19–32)
CREATININE: 0.94 mg/dL (ref 0.50–1.10)
Chloride: 103 mEq/L (ref 96–112)
GFR calc non Af Amer: 70 mL/min — ABNORMAL LOW (ref >90)
GFR, EST AFRICAN AMERICAN: 81 mL/min — AB (ref >90)
Glucose, Bld: 115 mg/dL — ABNORMAL HIGH (ref 70–99)
Potassium: 4.5 mEq/L (ref 3.7–5.3)
Sodium: 139 mEq/L (ref 137–147)

## 2013-07-18 LAB — HEMOGLOBIN AND HEMATOCRIT, BLOOD
HCT: 27.6 % — ABNORMAL LOW (ref 36.0–46.0)
HEMOGLOBIN: 9.7 g/dL — AB (ref 12.0–15.0)

## 2013-07-18 LAB — D-DIMER, QUANTITATIVE: D-Dimer, Quant: 0.41 ug/mL-FEU (ref 0.00–0.48)

## 2013-07-18 NOTE — Discharge Instructions (Signed)
Myalgia, Adult °Myalgia is the medical term for muscle pain. It is a symptom of many things. Nearly everyone at some time in their life has this. The most common cause for muscle pain is overuse or straining and more so when you are not in shape. Injuries and muscle bruises cause myalgias. Muscle pain without a history of injury can also be caused by a virus. It frequently comes along with the flu. Myalgia not caused by muscle strain can be present in a large number of infectious diseases. Some autoimmune diseases like lupus and fibromyalgia can cause muscle pain. Myalgia may be mild, or severe. °SYMPTOMS  °The symptoms of myalgia are simply muscle pain. Most of the time this is short lived and the pain goes away without treatment. °DIAGNOSIS  °Myalgia is diagnosed by your caregiver by taking your history. This means you tell him when the problems began, what they are, and what has been happening. If this has not been a long term problem, your caregiver may want to watch for a while to see what will happen. If it has been long term, they may want to do additional testing. °TREATMENT  °The treatment depends on what the underlying cause of the muscle pain is. Often anti-inflammatory medications will help. °HOME CARE INSTRUCTIONS °· If the pain in your muscles came from overuse, slow down your activities until the problems go away. °· Myalgia from overuse of a muscle can be treated with alternating hot and cold packs on the muscle affected or with cold for the first couple days. If either heat or cold seems to make things worse, stop their use. °· Apply ice to the sore area for 15-20 minutes, 03-04 times per day, while awake for the first 2 days of muscle soreness, or as directed. Put the ice in a plastic bag and place a towel between the bag of ice and your skin. °· Only take over-the-counter or prescription medicines for pain, discomfort, or fever as directed by your caregiver. °· Regular gentle exercise may help if  you are not active. °· Stretching before strenuous exercise can help lower the risk of myalgia. It is normal when beginning an exercise regimen to feel some muscle pain after exercising. Muscles that have not been used frequently will be sore at first. If the pain is extreme, this may mean injury to a muscle. °SEEK MEDICAL CARE IF: °· You have an increase in muscle pain that is not relieved with medication. °· You begin to run a temperature. °· You develop nausea and vomiting. °· You develop a stiff and painful neck. °· You develop a rash. °· You develop muscle pain after a tick bite. °· You have continued muscle pain while working out even after you are in good condition. °SEEK IMMEDIATE MEDICAL CARE IF: °Any of your problems are getting worse and medications are not helping. °MAKE SURE YOU:  °· Understand these instructions. °· Will watch your condition. °· Will get help right away if you are not doing well or get worse. °Document Released: 01/06/2006 Document Revised: 05/09/2011 Document Reviewed: 03/28/2006 °Exit[employer]® Patient Information ©2014 [employer]. ° °

## 2013-07-18 NOTE — ED Notes (Signed)
Pt presents with c/o right leg pain, shooting in nature, that started this past Tuesday night. Pt says says she has seen some minor swelling but not painful to touch and no heat or redness to the leg. Pt denies any injury to that leg, ambulatory to triage.

## 2013-07-18 NOTE — ED Provider Notes (Signed)
CSN: [id number]     Arrival date & time 07/18/13  1659 History   First MD Initiated Contact with Patient 07/18/13 1723     Chief Complaint  Patient presents with  . Leg Pain   HPI Comments: 53 y.o. [facility] for right leg pain x 3 days.  Patient states the leg pain starts on the lateral calf and shoots down to her calcaneous.  Pain lasts several seconds at a time and happens every 10-15 mins.  Patient has found no aggravating factors at this time.  Pain is relieved by elevation.  She states that she has not recently traveled, had surgery, had chest pain, SOB, hemoptysis, unilateral leg swelling, redness of the lower leg, or frank injury.  Patient does have history of HTN and takes diuretics and potassium.  She states that she is not always compliant with her potassium.  She is requesting that we check both her iron and potassium today.    Patient is a 53 y.o. female presenting with leg pain. The history is provided by the patient. No language interpreter was used.  Leg Pain Associated symptoms: no back pain and no fever     Past Medical History  Diagnosis Date  . Hypertension   . Hypokalemia   . Protein in urine    Past Surgical History  Procedure Laterality Date  . Breast reduction surgery    . Cesarean section    . Breast lumpectomy     No family history on file. History  Substance Use Topics  . Smoking status: Never Smoker   . Smokeless tobacco: Not on file  . Alcohol Use: No   OB History   Grav Para Term Preterm Abortions TAB SAB Ect Mult Living                 Review of Systems  Constitutional: Negative for fever, chills and activity change.  Respiratory: Negative for cough, chest tightness and shortness of breath.   Cardiovascular: Negative for chest pain, palpitations and leg swelling.  Musculoskeletal: Negative for arthralgias, back pain and joint swelling.  Skin: Negative for color change.  All other systems reviewed and are  negative.     Allergies  Ibuprofen  Home Medications   Prior to Admission medications   Medication Sig Start Date End Date Taking? Authorizing Provider  diltiazem (TIAZAC) 300 MG 24 hr capsule Take 300 mg by mouth daily.    Zada Girtichard F Fox, MD  furosemide (LASIX) 40 MG tablet Take 40 mg by mouth daily as needed.    Zada Girtichard F Fox, MD  iron polysaccharides (NIFEREX) 150 MG capsule Take 150 mg by mouth daily.    Zada Girtichard F Fox, MD  lisinopril-hydrochlorothiazide (PRINZIDE,ZESTORETIC) 20-12.5 MG per tablet Take 2 tablets by mouth daily.    Zada Girtichard F Fox, MD  potassium chloride SA (K-DUR,KLOR-CON) 20 MEQ tablet Take 20 mEq by mouth 2 (two) times daily.    Zada Girtichard F Fox, MD   BP 149/84  Pulse 94  Temp(Src) 98.6 F (37 C) (Oral)  Resp 18  SpO2 100% Physical Exam  Nursing note and vitals reviewed. Constitutional: She is oriented to person, place, and time. She appears well-developed and well-nourished. No distress.  HENT:  Head: Normocephalic and atraumatic.  Eyes: Conjunctivae are normal. No scleral icterus.  Neck: Normal range of motion. Neck supple.  Cardiovascular: Normal rate, regular rhythm, normal heart sounds and intact distal pulses.  Exam reveals no gallop and no friction rub.  No murmur heard. Pulmonary/Chest: Effort normal and breath sounds normal. No respiratory distress. She has no wheezes. She has no rales. She exhibits no tenderness.  Musculoskeletal: Normal range of motion.  No pretibial edema, erythema, or pallor noted on exam.  Mildly tender to palpation over the muscular insertion of the achilles tendon.  No palpable chords.  Negative Thompson's test.  Full active and passive ROM noted.  5/5 strength with dorsiflexion and plantarflexion.  Negative Homan's sign.    Neurological: She is alert and oriented to person, place, and time.  Skin: Skin is warm and dry. No rash noted. She is not diaphoretic. No erythema. No pallor.  Psychiatric: She has a normal mood and affect.  Her behavior is normal. Judgment and thought content normal.    ED Course  Procedures (including critical care time) Labs Review Labs Reviewed  BASIC METABOLIC PANEL - Abnormal; Notable for the following:    Glucose, Bld 115 (*)    Calcium 10.8 (*)    GFR calc non Af Amer 70 (*)    GFR calc Af Amer 81 (*)    All other components within normal limits  HEMOGLOBIN AND HEMATOCRIT, BLOOD - Abnormal; Notable for the following:    Hemoglobin 9.7 (*)    HCT 27.6 (*)    All other components within normal limits  D-DIMER, QUANTITATIVE    Imaging Review No results found.   EKG Interpretation None      MDM   Final diagnoses:  None   Leg pain - Physical exam and history present unclear etiology of leg pain.  CBC, BMP, and D-dimer were performed today in the ED.  Will rule out iron deficiency, hypokalemia, and DVT as cause of leg pain.  At this time D-dimer is negative.  Hgb is 9.7 and I counseled the patient to be more compliant with her iron supplementation.  BMP shows that all electrolytes are within normal range at this time.  Differential still includes achilles tendonitis which may have been brought on by recent change of shoes.  Recommend follow-up with your PCP in 2-3 days if symptoms persist or worsen.     Clydie Braunourtney A Forucci, PA-C 07/18/13 1926

## 2013-07-29 NOTE — ED Provider Notes (Signed)
Medical screening examination/treatment/procedure(s) were performed by non-physician practitioner and as supervising physician I was immediately available for consultation/collaboration.    Linwood Dibbles, MD 07/29/13 1322

## 2013-08-05 ENCOUNTER — Other Ambulatory Visit: Payer: Self-pay

## 2013-08-05 DIAGNOSIS — IMO0002 Reserved for concepts with insufficient information to code with codable children: Secondary | ICD-10-CM

## 2013-08-05 DIAGNOSIS — Z1231 Encounter for screening mammogram for malignant neoplasm of breast: Secondary | ICD-10-CM

## 2013-08-12 ENCOUNTER — Ambulatory Visit: Payer: 59

## 2013-08-13 ENCOUNTER — Ambulatory Visit
Admission: RE | Admit: 2013-08-13 | Discharge: 2013-08-13 | Disposition: A | Discharge status: Home / Self Care | Payer: 59 | Source: Ambulatory Visit

## 2013-08-13 DIAGNOSIS — IMO0002 Reserved for concepts with insufficient information to code with codable children: Secondary | ICD-10-CM

## 2013-08-13 DIAGNOSIS — Z1231 Encounter for screening mammogram for malignant neoplasm of breast: Secondary | ICD-10-CM

## 2013-09-13 ENCOUNTER — Other Ambulatory Visit: Payer: Self-pay

## 2013-09-16 LAB — CYTOLOGY - PAP

## 2014-08-06 ENCOUNTER — Emergency Department (HOSPITAL_COMMUNITY): Payer: 59

## 2014-08-06 ENCOUNTER — Encounter (HOSPITAL_COMMUNITY): Payer: Self-pay | Admitting: Emergency Medicine

## 2014-08-06 ENCOUNTER — Emergency Department (HOSPITAL_COMMUNITY)
Admission: EM | Admit: 2014-08-06 | Discharge: 2014-08-06 | Disposition: A | Discharge status: Home / Self Care | Payer: 59 | Attending: Emergency Medicine | Admitting: Emergency Medicine

## 2014-08-06 DIAGNOSIS — D649 Anemia, unspecified: Secondary | ICD-10-CM | POA: Diagnosis not present

## 2014-08-06 DIAGNOSIS — Z8639 Personal history of other endocrine, nutritional and metabolic disease: Secondary | ICD-10-CM | POA: Insufficient documentation

## 2014-08-06 DIAGNOSIS — K921 Melena: Secondary | ICD-10-CM | POA: Diagnosis not present

## 2014-08-06 DIAGNOSIS — Z79899 Other long term (current) drug therapy: Secondary | ICD-10-CM | POA: Insufficient documentation

## 2014-08-06 DIAGNOSIS — I10 Essential (primary) hypertension: Secondary | ICD-10-CM | POA: Insufficient documentation

## 2014-08-06 DIAGNOSIS — K625 Hemorrhage of anus and rectum: Secondary | ICD-10-CM | POA: Diagnosis present

## 2014-08-06 LAB — CBC
HCT: 30.1 % — ABNORMAL LOW (ref 36.0–46.0)
Hemoglobin: 9.9 g/dL — ABNORMAL LOW (ref 12.0–15.0)
MCH: 28.4 pg (ref 26.0–34.0)
MCHC: 32.9 g/dL (ref 30.0–36.0)
MCV: 86.5 fL (ref 78.0–100.0)
Platelets: 200 10*3/uL (ref 150–400)
RBC: 3.48 MIL/uL — ABNORMAL LOW (ref 3.87–5.11)
RDW: 13.2 % (ref 11.5–15.5)
WBC: 8.5 10*3/uL (ref 4.0–10.5)

## 2014-08-06 LAB — COMPREHENSIVE METABOLIC PANEL
ALBUMIN: 4.3 g/dL (ref 3.5–5.0)
ALK PHOS: 76 U/L (ref 38–126)
ALT: 14 U/L (ref 14–54)
AST: 16 U/L (ref 15–41)
Anion gap: 7 (ref 5–15)
BILIRUBIN TOTAL: 0.7 mg/dL (ref 0.3–1.2)
BUN: 18 mg/dL (ref 6–20)
CHLORIDE: 107 mmol/L (ref 101–111)
CO2: 25 mmol/L (ref 22–32)
Calcium: 10.5 mg/dL — ABNORMAL HIGH (ref 8.9–10.3)
Creatinine, Ser: 1.09 mg/dL — ABNORMAL HIGH (ref 0.44–1.00)
GFR calc Af Amer: >60 mL/min (ref >60)
GFR calc non Af Amer: 58 mL/min — ABNORMAL LOW (ref >60)
GLUCOSE: 151 mg/dL — AB (ref 65–99)
Potassium: 4.5 mmol/L (ref 3.5–5.1)
SODIUM: 139 mmol/L (ref 135–145)
Total Protein: 8 g/dL (ref 6.5–8.1)

## 2014-08-06 MED ORDER — HYOSCYAMINE SULFATE 0.125 MG PO TABS
0.1250 mg | ORAL_TABLET | Freq: Once | ORAL | Status: AC
  Administered 2014-08-06: 0.125 mg via ORAL
  Filled 2014-08-06 (×2): qty 1

## 2014-08-06 MED ORDER — HYOSCYAMINE SULFATE 0.125 MG SL SUBL: 0.1250 mg | SUBLINGUAL_TABLET | SUBLINGUAL | Status: DC | PRN

## 2014-08-06 NOTE — Discharge Instructions (Signed)
Bloody Stools  Bloody stools often mean that there is a problem in the digestive tract. Your caregiver may use the term "melena" to describe black, tarry, and bad smelling stools or "hematochezia" to describe red or maroon-colored stools. Blood seen in the stool can be caused by bleeding anywhere along the intestinal tract.   A black stool usually means that blood is coming from the upper part of the gastrointestinal tract (esophagus, stomach, or small bowel). Passing maroon-colored stools or bright red blood usually means that blood is coming from lower down in the large bowel or the rectum. However, sometimes massive bleeding in the stomach or small intestine can cause bright red bloody stools.   Consuming black licorice, lead, iron pills, medicines containing bismuth subsalicylate, or blueberries can also cause black stools. Your caregiver can test black stools to see if blood is present.  It is important that the cause of the bleeding be found. Treatment can then be started, and the problem can be corrected. Rectal bleeding may not be serious, but you should not assume everything is okay until you know the cause. It is very important to follow up with your caregiver or a specialist in gastrointestinal problems.  CAUSES   Blood in the stools can come from various underlying causes. Often, the cause is not found during your first visit. Testing is often needed to discover the cause of bleeding in the gastrointestinal tract. Causes range from simple to serious or even life-threatening. Possible causes include:  · Hemorrhoids. These are veins that are full of blood (engorged) in the rectum. They cause pain, inflammation, and may bleed.  · Anal fissures. These are areas of painful tearing which may bleed. They are often caused by passing hard stool.  · Diverticulosis. These are pouches that form on the colon over time, with age, and may bleed significantly.  · Diverticulitis. This is inflammation in areas with  diverticulosis. It can cause pain, fever, and bloody stools, although bleeding is rare.  · Proctitis and colitis. These are inflamed areas of the rectum or colon. They may cause pain, fever, and bloody stools.  · Polyps and cancer. Colon cancer is a leading cause of preventable cancer death. It often starts out as precancerous polyps that can be removed during a colonoscopy, preventing progression into cancer. Sometimes, polyps and cancer may cause rectal bleeding.  · Gastritis and ulcers. Bleeding from the upper gastrointestinal tract (near the stomach) may travel through the intestines and produce black, sometimes tarry, often bad smelling stools. In certain cases, if the bleeding is fast enough, the stools may not be black, but red and the condition may be life-threatening.  SYMPTOMS   You may have stools that are bright red and bloody, that are normal color with blood on them, or that are dark black and tarry. In some cases, you may only have blood in the toilet bowl. Any of these cases need medical care. You may also have:  · Pain at the anus or anywhere in the rectum.  · Lightheadedness or feeling faint.  · Extreme weakness.  · Nausea or vomiting.  · Fever.  DIAGNOSIS  Your caregiver may use the following methods to find the cause of your bleeding:  · Taking a medical history. Age is important. Older people tend to develop polyps and cancer more often. If there is anal pain and a hard, large stool associated with bleeding, a tear of the anus may be the cause. If blood drips into the toilet after a bowel movement, bleeding hemorrhoids may be the   problem. The color and frequency of the bleeding are additional considerations. In most cases, the medical history provides clues, but seldom the final answer.  · A visual and finger (digital) exam. Your caregiver will inspect the anal area, looking for tears and hemorrhoids. A finger exam can provide information when there is tenderness or a growth inside. In men, the  prostate is also examined.  · Endoscopy. Several types of small, long scopes (endoscopes) are used to view the colon.  ¨ In the office, your caregiver may use a rigid, or more commonly, a flexible viewing sigmoidoscope. This exam is called flexible sigmoidoscopy. It is performed in 5 to 10 minutes.  ¨ A more thorough exam is accomplished with a colonoscope. It allows your caregiver to view the entire 5 to 6 foot long colon. Medicine to help you relax (sedative) is usually given for this exam. Frequently, a bleeding lesion may be present beyond the reach of the sigmoidoscope. So, a colonoscopy may be the best exam to start with. Both exams are usually done on an outpatient basis. This means the patient does not stay overnight in the hospital or surgery center.  ¨ An upper endoscopy may be needed to examine your stomach. Sedation is used and a flexible endoscope is put in your mouth, down to your stomach.  · A barium enema X-ray. This is an X-ray exam. It uses liquid barium inserted by enema into the rectum. This test alone may not identify an actual bleeding point. X-rays highlight abnormal shadows, such as those made by lumps (tumors), diverticuli, or colitis.  TREATMENT   Treatment depends on the cause of your bleeding.   · For bleeding from the stomach or colon, the caregiver doing your endoscopy or colonoscopy may be able to stop the bleeding as part of the procedure.  · Inflammation or infection of the colon can be treated with medicines.  · Many rectal problems can be treated with creams, suppositories, or warm baths.  · Surgery is sometimes needed.  · Blood transfusions are sometimes needed if you have lost a lot of blood.  · For any bleeding problem, let your caregiver know if you take aspirin or other blood thinners regularly.  HOME CARE INSTRUCTIONS   · Take any medicines exactly as prescribed.  · Keep your stools soft by eating a diet high in fiber. Prunes (1 to 3 a day) work well for many people.  · Drink  enough water and fluids to keep your urine clear or pale yellow.  · Take sitz baths if advised. A sitz bath is when you sit in a bathtub with warm water for 10 to 15 minutes to soak, soothe, and cleanse the rectal area.  · If enemas or suppositories are advised, be sure you know how to use them. Tell your caregiver if you have problems with this.  · Monitor your bowel movements to look for signs of improvement or worsening.  SEEK MEDICAL CARE IF:   · You do not improve in the time expected.  · Your condition worsens after initial improvement.  · You develop any new symptoms.  SEEK IMMEDIATE MEDICAL CARE IF:   · You develop severe or prolonged rectal bleeding.  · You vomit blood.  · You feel weak or faint.  · You have a fever.  MAKE SURE YOU:  · Understand these instructions.  · Will watch your condition.  · Will get help right away if you are not doing well or get worse.    Document Released: 02/04/2002 Document Revised: 05/09/2011 Document Reviewed: 07/02/2010  Exit[employer]® Patient Information ©2015 [employer]. This information is not intended to replace advice given to you by your health care provider. Make sure you discuss any questions you have with your health care provider.

## 2014-08-06 NOTE — ED Provider Notes (Signed)
CSN: [id number]     Arrival date & time 08/06/14  0848 History   First MD Initiated Contact with Patient 08/06/14 1038     Chief Complaint  Patient presents with  . Rectal Bleeding      Patient is a 54 y.o. female presenting with hematochezia. The history is provided by the patient. No language interpreter was used.  Rectal Bleeding  Susan Haley presents for evaluation of abdominal cramping and hematochezia. She developed lower abdominal cramping and diarrhea with a small amount of vomiting 2 days ago. Her symptoms improved yesterday and then today she developed increased lower abdominal cramping and hematochezia. She had a small amount of bright red blood per rectum yesterday. Today she's had 3 bowel movements that are all red blood. She denies any fevers, dysuria, weakness. She has a history of kidney disease and chronic anemia. She states that her baseline hemoglobin is around 9. She does not have a gastroenterologist. She takes no blood thinners. She has a history of C-section and hysterectomy. Sxs are moderate, constant, worsening.   Past Medical History  Diagnosis Date  . Hypertension   . Hypokalemia   . Protein in urine    Past Surgical History  Procedure Laterality Date  . Breast reduction surgery    . Cesarean section    . Breast lumpectomy    . Abdominal hysterectomy     Family History  Problem Relation Age of Onset  . Hypertension Mother   . Diabetes Mother   . Cancer Father   . Diabetes Father   . Hypertension Father    History  Substance Use Topics  . Smoking status: Never Smoker   . Smokeless tobacco: Not on file  . Alcohol Use: No   OB History    No data available     Review of Systems  Gastrointestinal: Positive for hematochezia.  All other systems reviewed and are negative.     Allergies  Ibuprofen  Home Medications   Prior to Admission medications   Medication Sig Start Date End Date Taking? Authorizing Provider  diltiazem (DILACOR XR) 240 MG  24 hr capsule Take 240 mg by mouth daily.   Yes Historical Provider, MD  furosemide (LASIX) 40 MG tablet Take 40 mg by mouth daily as needed for fluid.    Yes Marina Gravelichard Fox, MD  iron polysaccharides (NIFEREX) 150 MG capsule Take 150 mg by mouth daily.   Yes Marina Gravelichard Fox, MD  lisinopril (PRINIVIL,ZESTRIL) 20 MG tablet Take 20 mg by mouth daily.   Yes Historical Provider, MD  potassium chloride SA (K-DUR,KLOR-CON) 20 MEQ tablet Take 20 mEq by mouth 2 (two) times daily.   Yes Marina Gravelichard Fox, MD   BP 133/86 mmHg  Pulse 98  Temp(Src) 98 F (36.7 C) (Oral)  Resp 16  SpO2 98% Physical Exam  Constitutional: She is oriented to person, place, and time. She appears well-developed and well-nourished.  HENT:  Head: Normocephalic and atraumatic.  Cardiovascular: Normal rate and regular rhythm.   No murmur heard. Pulmonary/Chest: Effort normal and breath sounds normal. No respiratory distress.  Abdominal: Soft. There is no rebound and no guarding.  Mild lower abdominal tenderness without any guarding or rebound  Musculoskeletal: She exhibits no edema or tenderness.  Neurological: She is alert and oriented to person, place, and time.  Skin: Skin is warm and dry.  Psychiatric: She has a normal mood and affect. Her behavior is normal.  Nursing note and vitals reviewed.   ED Course  Procedures (including  critical care time) Labs Review Labs Reviewed  CBC - Abnormal; Notable for the following:    RBC 3.48 (*)    Hemoglobin 9.9 (*)    HCT 30.1 (*)    All other components within normal limits  COMPREHENSIVE METABOLIC PANEL - Abnormal; Notable for the following:    Glucose, Bld 151 (*)    Creatinine, Ser 1.09 (*)    Calcium 10.5 (*)    GFR calc non Af Amer 58 (*)    All other components within normal limits    Imaging Review No results found.   EKG Interpretation None      MDM   Final diagnoses:  Hematochezia    Patient here for evaluation of hematochezia, she does not take any blood  thinners. Patient is nontoxic appearing on examination with benign abdominal examination. Patient has a history of chronic anemia and her baseline hemoglobin is around 9. Clinical picture is not consistent with massive GI bleed, diverticulitis. Discussed with on-call provider with [facility], plan to DC home with close gastroenterology follow-up. Discussed close return precautions.    Tilden Fossa, MD 08/06/14 1525

## 2014-08-06 NOTE — ED Notes (Signed)
Pt staes on Monday she was having some diarrhea  Tuesday she noticed she had some blood mixed with the stool and this morning it is all blood  Pt is c/o some lower abd cramping

## 2014-08-20 ENCOUNTER — Other Ambulatory Visit (INDEPENDENT_AMBULATORY_CARE_PROVIDER_SITE_OTHER): Payer: 59

## 2014-08-20 ENCOUNTER — Encounter: Payer: Self-pay | Admitting: Physician Assistant

## 2014-08-20 ENCOUNTER — Ambulatory Visit (INDEPENDENT_AMBULATORY_CARE_PROVIDER_SITE_OTHER): Payer: 59 | Admitting: Physician Assistant

## 2014-08-20 VITALS — BP 114/70 | HR 96 | Ht 63.0 in | Wt 144.0 lb

## 2014-08-20 DIAGNOSIS — Z1211 Encounter for screening for malignant neoplasm of colon: Secondary | ICD-10-CM

## 2014-08-20 DIAGNOSIS — R1011 Right upper quadrant pain: Secondary | ICD-10-CM

## 2014-08-20 DIAGNOSIS — K625 Hemorrhage of anus and rectum: Secondary | ICD-10-CM | POA: Diagnosis not present

## 2014-08-20 DIAGNOSIS — D509 Iron deficiency anemia, unspecified: Secondary | ICD-10-CM

## 2014-08-20 LAB — IBC PANEL
IRON: 67 ug/dL (ref 42–145)
SATURATION RATIOS: 21.3 % (ref 20.0–50.0)
Transferrin: 225 mg/dL (ref 212.0–360.0)

## 2014-08-20 LAB — IGA: IGA: 296 mg/dL (ref 68–378)

## 2014-08-20 LAB — FERRITIN: Ferritin: 194.3 ng/mL (ref 10.0–291.0)

## 2014-08-20 NOTE — Patient Instructions (Signed)
Please go to the basement level to have your labs drawn.  We have given you a Low Fat Diet brochure. You have been scheduled for an abdominal ultrasound at [facility] (1st floor of hospital) on 08-26-2014 at 8:00 am. Please arrive at 7:45 am to your appointment for registration. Make certain not to have anything to eat or drink 6 hours prior to your appointment. Should you need to reschedule your appointment, please contact radiology at [phone]. This test typically takes about 30 minutes to perform.   You have been scheduled for a colonoscopy. Please follow written instructions given to you at your visit today.  We have given you the prep sample for the colonoscopy. If you use inhalers (even only as needed), please bring them with you on the day of your procedure. Your physician has requested that you go to www.startemmi.com and enter the access code given to you at your visit today. This web site gives a general overview about your procedure. However, you should still follow specific instructions given to you by our office regarding your preparation for the procedure.

## 2014-08-20 NOTE — Progress Notes (Signed)
Agree with assessment and plans as outlined 

## 2014-08-20 NOTE — Progress Notes (Signed)
Patient ID: Susan Haley, female   DOB: October 29, 1960, 54 y.o.   MRN: [id number]    HPI:  Susan Haley is a 54 y.o.   female referred by Susan Gravel, MD for evaluation of rectal bleeding.  Susan Haley has a past medical history significant for membranous glomerulopathy, hypertension, borderline hypercholesterolemia, and persistent anemia. She states she has been anemic for many years. It was initially thought to be related to heavy menses but she then had a hysterectomy in 2008 with no improvement of her anemia in 2012 she was diagnosed with membranous nephropathy and started on treatment with Cytoxan and prednisone in December 2012 through June 2013 her hemoglobin at that time was in the range of 9-9.5. In June 2014 repeat CBC showed hemoglobin of 9.1 iron studies revealed a serum iron of 68, TIBC of 216, and iron saturation 26%. She was started on treatment with varix +150 mg by mouth but did not take it on a regular basis. She has no family history of sickle cell disease. On 08/06/2014 she presented to the emergency room with a 2 day history of lower abdominal cramping and diarrhea associated with rectal bleeding. She states she had several bowel movements that had a fair to large amount of blood. She had no associated fever or chills or night sweats and did not feel weak. Her hemoglobin was found to be 9.9 in the emergency room and as she was nontoxic appearing and had a benign exam she was discharged home and advised to follow-up with GI. Since that visit she has had a formed bowel movement on a daily basis with no bleeding. She reports that her father had colon polyps in his early 69s but there is no known family history of colon cancer or inflammatory bowel disease. She does have a sister with IBS.  She also reports that over the past year she has had multiple episodes of epigastric pain that occur postprandially if she eats pizza or fried food. The pain will last for several hours and is often associated  with vomiting bloating and diarrhea. She has no heartburn on a daily basis but occasionally belches. She has no nocturnal regurgitation or cough. She is not aware of the family history of gallbladder disease.   Past Medical History  Diagnosis Date  . Hypertension   . Hypokalemia   . Protein in urine   . 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 Breast reduction surgery    . Cesarean section      x 2  . Breast lumpectomy Right   . Abdominal hysterectomy    . Uterine fibroid surgery      x 2   Family History  Problem Relation Age of Onset  . Hypertension Mother   . Diabetes Mother   . Prostate cancer Father   . Diabetes Father   . Hypertension Father   . Breast cancer Maternal Aunt   . Irritable bowel syndrome Sister    History  Substance Use Topics  . Smoking status: Never Smoker   . Smokeless tobacco: Never Used  . Alcohol Use: No   Current Outpatient Prescriptions  Medication Sig Dispense Refill  . diltiazem (DILACOR XR) 240 MG 24 hr capsule Take 240 mg by mouth daily.    . furosemide (LASIX) 40 MG tablet Take 40 mg by mouth daily as needed for fluid.     . hyoscyamine (LEVSIN/SL) 0.125 MG SL tablet Place  1 tablet (0.125 mg total) under the tongue every 4 (four) hours as needed. 8 tablet 0  . iron polysaccharides (NIFEREX) 150 MG capsule Take 150 mg by mouth daily.    Marland Kitchen lisinopril (PRINIVIL,ZESTRIL) 20 MG tablet Take 20 mg by mouth daily.    . potassium chloride SA (K-DUR,KLOR-CON) 20 MEQ tablet Take 20 mEq by mouth 2 (two) times daily.     No current facility-administered medications for this visit.   Allergies  Allergen Reactions  . Ibuprofen Swelling     Review of Systems: Gen: Denies any fever, chills, sweats, anorexia, fatigue, weakness, malaise, weight loss, and sleep disorder CV: Denies chest pain, angina, palpitations, syncope, orthopnea, PND, peripheral edema, and claudication. Resp: Denies  dyspnea at rest, dyspnea with exercise, cough, sputum, wheezing, coughing up blood, and pleurisy. GI: Denies vomiting blood, jaundice, and fecal incontinence.   Denies dysphagia or odynophagia. GU : Denies urinary burning, blood in urine, urinary frequency, urinary hesitancy, nocturnal urination, and urinary incontinence. MS: Denies joint pain, limitation of movement, and swelling, stiffness, low back pain, extremity pain. Denies muscle weakness, cramps, atrophy.  Derm: Denies rash, itching, dry skin, hives, moles, warts, or unhealing ulcers.  Psych: Denies depression, anxiety, memory loss, suicidal ideation, hallucinations, paranoia, and confusion. Heme: Denies bruising, bleeding, and enlarged lymph nodes. Neuro:  Denies any headaches, dizziness, paresthesias. Endo:  Denies any problems with DM, thyroid, adrenal function     LAB RESULTS: Blood work on 08/06/2014 showed alkaline phosphatase 76, AST 16, ALT 14, total bili 0.7. CBC on 08/06/2014 white count 8.5, hemoglobin 9.9, hematocrit 30.1, platelets 200,000. MCV 86.5.   Physical Exam: BP 114/70 mmHg  Pulse 96  Ht  (1.6 m)  Wt 144 lb (65.318 kg)  BMI 25.51 kg/m2 Constitutional: Pleasant,well-developed,  African-American female in no acute distress. HEENT: Normocephalic and atraumatic. Conjunctivae are normal. No scleral icterus. Neck supple. No thyromegaly Cardiovascular: Normal rate, regular rhythm.  Pulmonary/chest: Effort normal and breath sounds normal. No wheezing, rales or rhonchi. Abdominal: Soft, nondistended, nontender. Bowel sounds active throughout. There are no masses palpable. No hepatomegaly. Extremities: no edema Lymphadenopathy: No cervical adenopathy noted. Neurological: Alert and oriented to person place and time. Skin: Skin is warm and dry. No rashes noted. Psychiatric: Normal mood and affect. Behavior is normal.  ASSESSMENT AND PLAN: #1. Epigastric pain. This tends to occur postprandially only when she  eats fatty or fried food. Recent LFTs were normal. Will obtain abdominal ultrasound to evaluate for possible cholelithiasis, dilated ducts etc.  #2. Rectal bleeding. Appears to have been self-limited and patient has not had any recurrent bleeding or diarrhea since. She is over 50 and is interested in colorectal cancer screening and will thus be scheduled for a colonoscopy to screen for polyps, neoplasia, AVMs, internal hemorrhoids etc.The risks, benefits, and alternatives to colonoscopy with possible biopsy and possible polypectomy were discussed with the patient and they consent to proceed. The procedure will be scheduled with Dr. Marina Goodell. Patient will also be sent for repeat iron studies as well as an IgA and TTG.  Further recommendations will be made pending the findings of the above.      Nekeisha Aure, Moise Boring 08/20/2014, 4:14 PM  CC: Susan Gravel, MD

## 2014-08-21 ENCOUNTER — Encounter: Payer: Self-pay | Admitting: Internal Medicine

## 2014-08-21 ENCOUNTER — Ambulatory Visit (AMBULATORY_SURGERY_CENTER): Payer: 59 | Admitting: Internal Medicine

## 2014-08-21 VITALS — BP 96/51 | HR 62 | Temp 98.2°F | Resp 19 | Ht 63.0 in | Wt 144.0 lb

## 2014-08-21 DIAGNOSIS — Z1211 Encounter for screening for malignant neoplasm of colon: Secondary | ICD-10-CM

## 2014-08-21 DIAGNOSIS — K625 Hemorrhage of anus and rectum: Secondary | ICD-10-CM

## 2014-08-21 LAB — TISSUE TRANSGLUTAMINASE, IGG: Tissue Transglut Ab: 1 U/mL (ref <6)

## 2014-08-21 MED ORDER — SODIUM CHLORIDE 0.9 % IV SOLN: 500.0000 mL | INTRAVENOUS | Status: DC

## 2014-08-21 NOTE — Op Note (Signed)
[facility] [address] [place], [address]   COLONOSCOPY PROCEDURE REPORT  PATIENT: Susan Haley, Susan Haley  MR#: [id number] BIRTHDATE: 03-21-1960 , 53  yrs. old GENDER: female ENDOSCOPIST: Roxy Cedar, MD REFERRED LX:BWIO Sanford, MD PROCEDURE DATE:  08/21/2014 PROCEDURE:   Colonoscopy, screening First Screening Colonoscopy - Avg.  risk and is 50 yrs.  old or older Yes.  Prior Negative Screening - Now for repeat screening. N/A  History of Adenoma - Now for follow-up colonoscopy & has been > or = to 3 yrs.  N/A  Polyps removed today? No Recommend repeat exam, <10 yrs? No ASA CLASS:   Class III INDICATIONS:Screening for colonic neoplasia and Colorectal Neoplasm Risk Assessment for this procedure is average risk. MEDICATIONS: Propofol 230 mg IV and Monitored anesthesia care  DESCRIPTION OF PROCEDURE:   After the risks benefits and alternatives of the procedure were thoroughly explained, informed consent was obtained.  The digital rectal exam revealed no abnormalities of the rectum.   The LB [id number]  endoscope was introduced through the anus and advanced to the cecum, which was identified by both the appendix and ileocecal valve. No adverse events experienced.   The quality of the prep was excellent. (Suprep was used)  The instrument was then slowly withdrawn as the colon was fully examined. Estimated blood loss is zero unless otherwise noted in this procedure report.   COLON FINDINGS: There was moderate diverticulosis noted in the transverse colon and right colon.   The examination was otherwise normal.  Retroflexed views revealed internal hemorrhoids. The time to cecum = 3.4 Withdrawal time = 7.3   The scope was withdrawn and the procedure completed. COMPLICATIONS: There were no immediate complications.  ENDOSCOPIC IMPRESSION: 1.   Moderate diverticulosis was noted in the transverse colon and right colon 2.   The examination was otherwise  normal  RECOMMENDATIONS: 1.  Continue current colorectal screening recommendations for "routine risk" patients with a repeat colonoscopy in 10 years. 2.  Upper endoscopy to be scheduled an [facility] "anemia, epigastric pain" 3.  Keep plans for ultrasound is scheduled  eSigned:  Roxy Cedar, MD 08/21/2014 5:06 PM   cc: The Patient    ; Thayer Dallas.D.

## 2014-08-21 NOTE — Patient Instructions (Signed)
YOU HAD AN ENDOSCOPIC PROCEDURE TODAY AT THE [facility]:   Refer to the procedure report that was given to you for any specific questions about what was found during the examination.  If the procedure report does not answer your questions, please call your gastroenterologist to clarify.  If you requested that your care partner not be given the details of your procedure findings, then the procedure report has been included in a sealed envelope for you to review at your convenience later.  YOU SHOULD EXPECT: Some feelings of bloating in the abdomen. Passage of more gas than usual.  Walking can help get rid of the air that was put into your GI tract during the procedure and reduce the bloating. If you had a lower endoscopy (such as a colonoscopy or flexible sigmoidoscopy) you may notice spotting of blood in your stool or on the toilet paper. If you underwent a bowel prep for your procedure, you may not have a normal bowel movement for a few days.  Please Note:  You might notice some irritation and congestion in your nose or some drainage.  This is from the oxygen used during your procedure.  There is no need for concern and it should clear up in a day or so.  SYMPTOMS TO REPORT IMMEDIATELY:   Following lower endoscopy (colonoscopy or flexible sigmoidoscopy):  Excessive amounts of blood in the stool  Significant tenderness or worsening of abdominal pains  Swelling of the abdomen that is new, acute  Fever of 100F or higher  For urgent or emergent issues, a gastroenterologist can be reached at any hour by calling [phone].   DIET: Your first meal following the procedure should be a small meal and then it is ok to progress to your normal diet. Heavy or fried foods are harder to digest and may make you feel nauseous or bloated.  Likewise, meals heavy in dairy and vegetables can increase bloating.  Drink plenty of fluids but you should avoid alcoholic beverages for 24  hours.  ACTIVITY:  You should plan to take it easy for the rest of today and you should NOT DRIVE or use heavy machinery until tomorrow (because of the sedation medicines used during the test).    FOLLOW UP: Our staff will call the number listed on your records the next business day following your procedure to check on you and address any questions or concerns that you may have regarding the information given to you following your procedure. If we do not reach you, we will leave a message.  However, if you are feeling well and you are not experiencing any problems, there is no need to return our call.  We will assume that you have returned to your regular daily activities without incident.  If any biopsies were taken you will be contacted by phone or by letter within the next 1-3 weeks.  Please call us at [phone] if you have not heard about the biopsies in 3 weeks.    SIGNATURES/CONFIDENTIALITY: You and/or your care partner have signed paperwork which will be entered into your electronic medical record.  These signatures attest to the fact that that the information above on your After Visit Summary has been reviewed and is understood.  Full responsibility of the confidentiality of this discharge information lies with you and/or your care-partner.  Repeat Colonoscopy in 10 years Upper endoscopy scheduled  Keep plans scheduled for ultrasound is scheduled

## 2014-08-21 NOTE — Progress Notes (Signed)
Pt. Stated that she felt "numbness" when IV started Left anticubital.  Good blood return noted. Complained that her forarm felt achy.  IV slowed down.  When rechecked 10-15 minutes Later she stated that her arm "felt better".  IV infusing slowly.

## 2014-08-21 NOTE — Progress Notes (Signed)
To recovery, report to Smith, RN, VSS 

## 2014-08-22 ENCOUNTER — Telehealth: Payer: Self-pay | Admitting: *Deleted

## 2014-08-22 NOTE — Telephone Encounter (Signed)
  Follow up Call-  Call back number 08/21/2014  Post procedure Call Back phone  # [phone]  Permission to leave phone message Yes     Patient questions:  Do you have a fever, pain , or abdominal swelling? No. Pain Score  0 *  Have you tolerated food without any problems? Yes.    Have you been able to return to your normal activities? Yes.    Do you have any questions about your discharge instructions: Diet   No. Medications  No. Follow up visit  No.  Do you have questions or concerns about your Care? No.  Actions: * If pain score is 4 or above: No action needed, pain <4.

## 2014-08-26 ENCOUNTER — Ambulatory Visit (HOSPITAL_COMMUNITY)
Admission: RE | Admit: 2014-08-26 | Discharge: 2014-08-26 | Disposition: A | Discharge status: Home / Self Care | Payer: 59 | Source: Ambulatory Visit | Attending: Physician Assistant | Admitting: Physician Assistant

## 2014-08-26 DIAGNOSIS — R1011 Right upper quadrant pain: Secondary | ICD-10-CM | POA: Diagnosis present

## 2014-09-19 ENCOUNTER — Ambulatory Visit (AMBULATORY_SURGERY_CENTER): Payer: Self-pay

## 2014-09-19 VITALS — Ht 63.0 in | Wt 147.0 lb

## 2014-09-19 DIAGNOSIS — R1011 Right upper quadrant pain: Secondary | ICD-10-CM

## 2014-09-19 NOTE — Progress Notes (Signed)
No allergies to eggs or soy No diet/weight loss meds No home oxygen No past problems with anesthesia Has email.  Emmi instructions given for EGD 

## 2014-10-03 ENCOUNTER — Encounter: Payer: Self-pay | Admitting: Internal Medicine

## 2014-10-03 ENCOUNTER — Ambulatory Visit (AMBULATORY_SURGERY_CENTER): Payer: 59 | Admitting: Internal Medicine

## 2014-10-03 VITALS — BP 108/72 | HR 64 | Temp 95.9°F | Resp 19 | Ht 63.0 in | Wt 147.0 lb

## 2014-10-03 DIAGNOSIS — D509 Iron deficiency anemia, unspecified: Secondary | ICD-10-CM | POA: Diagnosis not present

## 2014-10-03 DIAGNOSIS — R1013 Epigastric pain: Secondary | ICD-10-CM | POA: Diagnosis present

## 2014-10-03 DIAGNOSIS — R1011 Right upper quadrant pain: Secondary | ICD-10-CM

## 2014-10-03 MED ORDER — SODIUM CHLORIDE 0.9 % IV SOLN: 500.0000 mL | INTRAVENOUS | Status: DC

## 2014-10-03 NOTE — Op Note (Signed)
 [facility] [address] [place], [address]   ENDOSCOPY PROCEDURE REPORT  PATIENT: Susan Haley, Susan Haley  MR#: [id number] BIRTHDATE: 16-Oct-1960 , 53  yrs. old GENDER: female ENDOSCOPIST: Roxy Cedar, MD REFERRED BY:  Sabra Heck, MD PROCEDURE DATE:  10/03/2014 PROCEDURE:  EGD, diagnostic ASA CLASS:     Class II INDICATIONS:  epigastric pain and anemia. MEDICATIONS: Monitored anesthesia care and Propofol 180 mg IV TOPICAL ANESTHETIC: none  DESCRIPTION OF PROCEDURE: After the risks benefits and alternatives of the procedure were thoroughly explained, informed consent was obtained.  The LB [id number] endoscope was introduced through the mouth and advanced to the second portion of the duodenum , Without limitations.  The instrument was slowly withdrawn as the mucosa was fully examined.   EXAM: The esophagus and gastroesophageal junction were completely normal in appearance.  The stomach was entered and closely examined.The antrum, angularis, and lesser curvature were well visualized, including a retroflexed view of the cardia and fundus. The stomach wall was normally distensable.  The scope passed easily through the pylorus into the duodenum.  Retroflexed views revealed no abnormalities.     The scope was then withdrawn from the patient and the procedure completed.  COMPLICATIONS: There were no immediate complications.  ENDOSCOPIC IMPRESSION: 1. Normal EGD 2. No explanation for anemia  RECOMMENDATIONS: 1.Continue iron supplementation 2. Return to the care of your primary provider who should monitor blood counts. If anemia persists, would recommend a hematology evaluation which can be arranged by your primary care provider 3. GI follow-up as needed  REPEAT EXAM:  eSigned:  Roxy Cedar, MD 10/03/2014 2:47 PM    CC:The Patient

## 2014-10-03 NOTE — Patient Instructions (Signed)
YOU HAD AN ENDOSCOPIC PROCEDURE TODAY AT THE [facility]:   Refer to the procedure report that was given to you for any specific questions about what was found during the examination.  If the procedure report does not answer your questions, please call your gastroenterologist to clarify.  If you requested that your care partner not be given the details of your procedure findings, then the procedure report has been included in a sealed envelope for you to review at your convenience later.  YOU SHOULD EXPECT: Some feelings of bloating in the abdomen. Passage of more gas than usual.  Walking can help get rid of the air that was put into your GI tract during the procedure and reduce the bloating. If you had a lower endoscopy (such as a colonoscopy or flexible sigmoidoscopy) you may notice spotting of blood in your stool or on the toilet paper. If you underwent a bowel prep for your procedure, you may not have a normal bowel movement for a few days.  Please Note:  You might notice some irritation and congestion in your nose or some drainage.  This is from the oxygen used during your procedure.  There is no need for concern and it should clear up in a day or so.  SYMPTOMS TO REPORT IMMEDIATELY:     Following upper endoscopy (EGD)  Vomiting of blood or coffee ground material  New chest pain or pain under the shoulder blades  Painful or persistently difficult swallowing  New shortness of breath  Fever of 100F or higher  Black, tarry-looking stools  For urgent or emergent issues, a gastroenterologist can be reached at any hour by calling [phone].  Normal endoscopy.  Continue iron supplementation.   DIET: Your first meal following the procedure should be a small meal and then it is ok to progress to your normal diet. Heavy or fried foods are harder to digest and may make you feel nauseous or bloated.  Likewise, meals heavy in dairy and vegetables can increase bloating.  Drink  plenty of fluids but you should avoid alcoholic beverages for 24 hours.  ACTIVITY:  You should plan to take it easy for the rest of today and you should NOT DRIVE or use heavy machinery until tomorrow (because of the sedation medicines used during the test).    FOLLOW UP: Our staff will call the number listed on your records the next business day following your procedure to check on you and address any questions or concerns that you may have regarding the information given to you following your procedure. If we do not reach you, we will leave a message.  However, if you are feeling well and you are not experiencing any problems, there is no need to return our call.  We will assume that you have returned to your regular daily activities without incident.  If any biopsies were taken you will be contacted by phone or by letter within the next 1-3 weeks.  Please call us at [phone] if you have not heard about the biopsies in 3 weeks.    SIGNATURES/CONFIDENTIALITY: You and/or your care partner have signed paperwork which will be entered into your electronic medical record.  These signatures attest to the fact that that the information above on your After Visit Summary has been reviewed and is understood.  Full responsibility of the confidentiality of this discharge information lies with you and/or your care-partner.

## 2014-10-03 NOTE — Progress Notes (Signed)
To recovery, report to Scott, RN, VSS 

## 2014-10-06 ENCOUNTER — Telehealth: Payer: Self-pay

## 2014-10-06 NOTE — Telephone Encounter (Signed)
  Follow up Call-  Call back number 10/03/2014 08/21/2014  Post procedure Call Back phone  # [phone]  Permission to leave phone message Yes Yes     Patient questions:  Do you have a fever, pain , or abdominal swelling? No. Pain Score  0 *  Have you tolerated food without any problems? Yes.    Have you been able to return to your normal activities? Yes.    Do you have any questions about your discharge instructions: Diet   No. Medications  No. Follow up visit  No.  Do you have questions or concerns about your Care? Yes.    Actions: * If pain score is 4 or above: No action needed, pain <4.

## 2015-06-25 ENCOUNTER — Other Ambulatory Visit: Payer: Self-pay

## 2015-06-25 DIAGNOSIS — Z9889 Other specified postprocedural states: Secondary | ICD-10-CM

## 2015-06-25 DIAGNOSIS — Z1231 Encounter for screening mammogram for malignant neoplasm of breast: Secondary | ICD-10-CM

## 2015-07-14 ENCOUNTER — Ambulatory Visit: Payer: 59

## 2015-07-15 ENCOUNTER — Inpatient Hospital Stay: Admission: RE | Admit: 2015-07-15 | Payer: 59 | Source: Ambulatory Visit

## 2015-08-28 ENCOUNTER — Ambulatory Visit: Payer: 59

## 2015-08-31 ENCOUNTER — Ambulatory Visit
Admission: RE | Admit: 2015-08-31 | Discharge: 2015-08-31 | Disposition: A | Discharge status: Home / Self Care | Payer: 59 | Source: Ambulatory Visit

## 2015-08-31 DIAGNOSIS — Z9889 Other specified postprocedural states: Secondary | ICD-10-CM

## 2015-08-31 DIAGNOSIS — Z1231 Encounter for screening mammogram for malignant neoplasm of breast: Secondary | ICD-10-CM

## 2015-10-18 IMAGING — US US ABDOMEN COMPLETE
1 series · 14 of 25 positions shown · non-contrast
Comparison: None.

CLINICAL DATA: Right upper quadrant abdominal pain

EXAM:
ULTRASOUND ABDOMEN COMPLETE

[Series 1: us abdomen complete · 0.15mm/px · 14 of 72 slices shown]
[im 1/72]
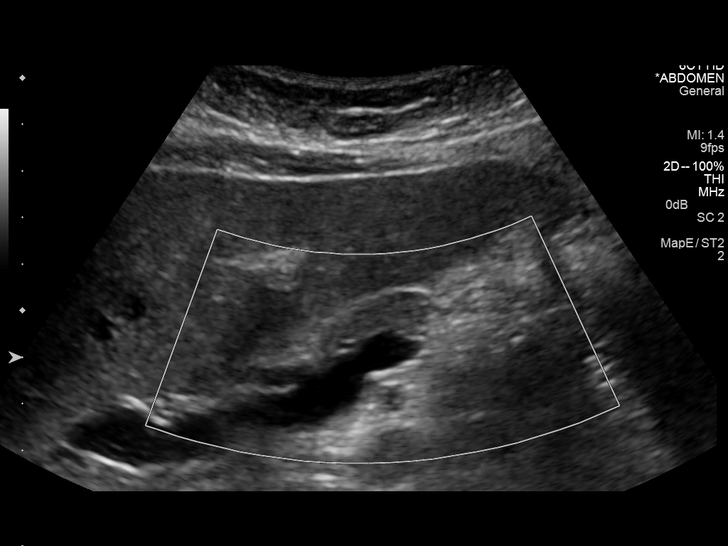
[im 6/72]
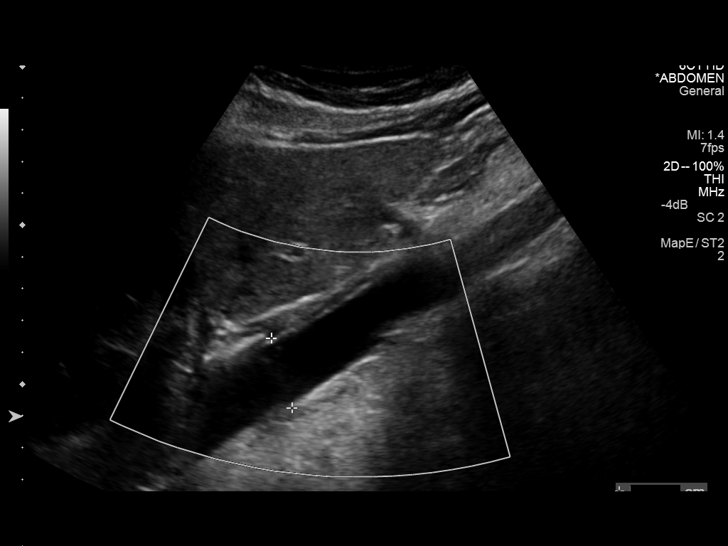
[im 12/72]
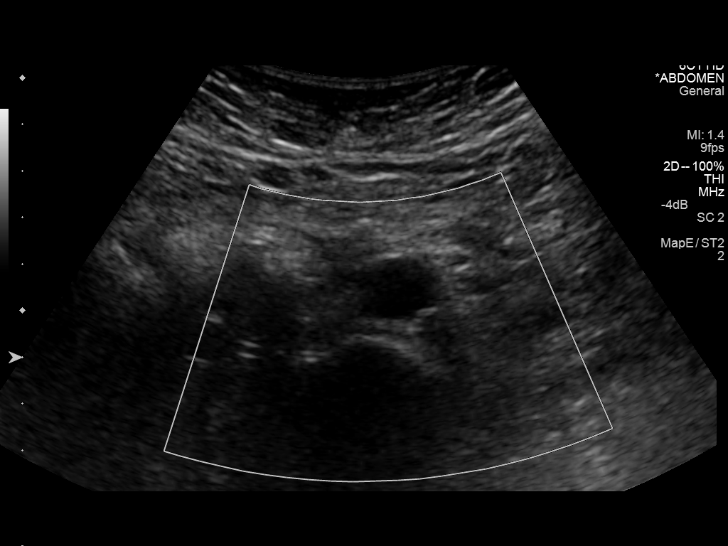
[im 18/72]
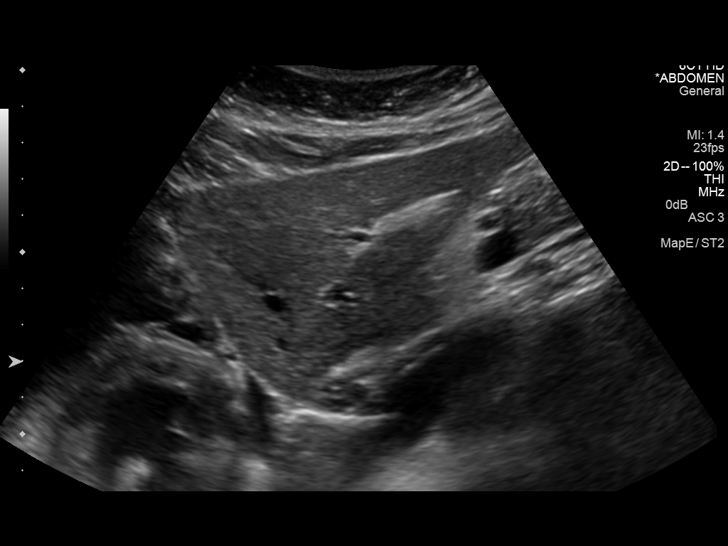
[im 24/72]
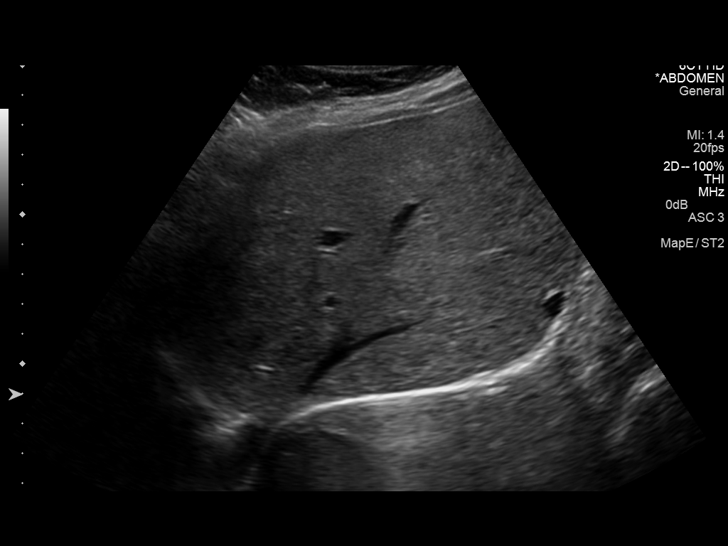
[im 27/72]
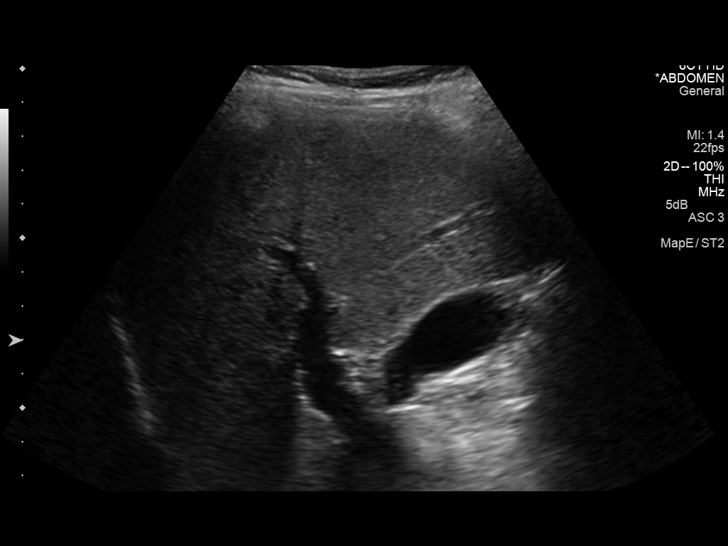
[im 33/72]
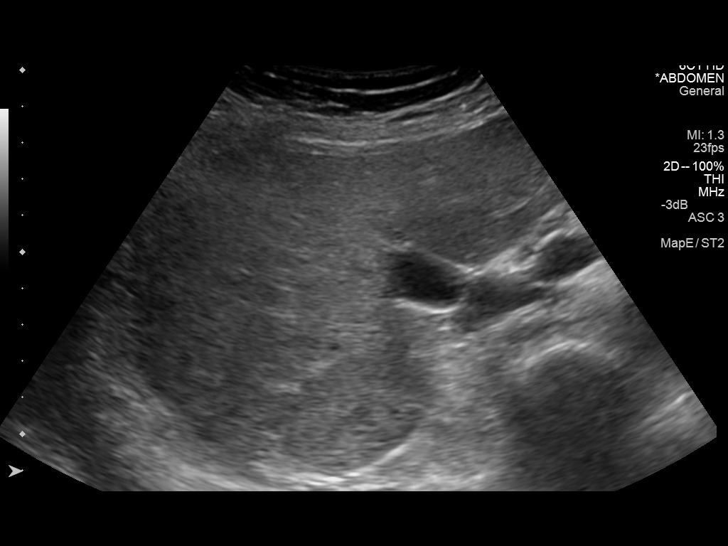
[im 39/72]
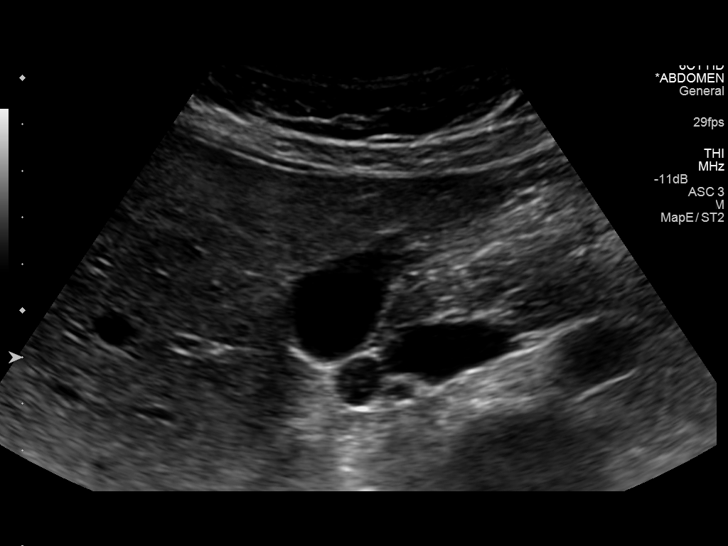
[im 45/72]
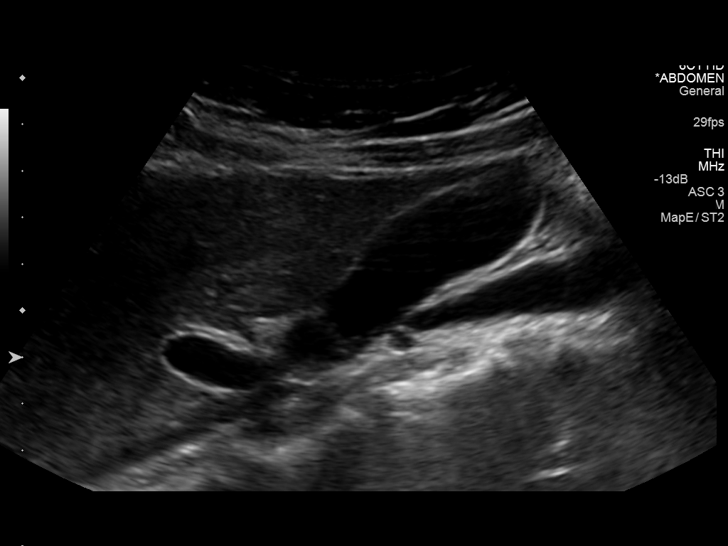
[im 48/72]
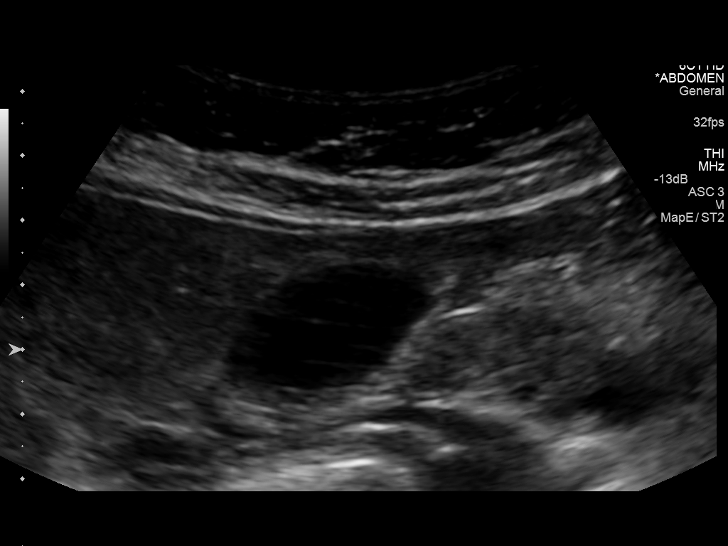
[im 54/72]
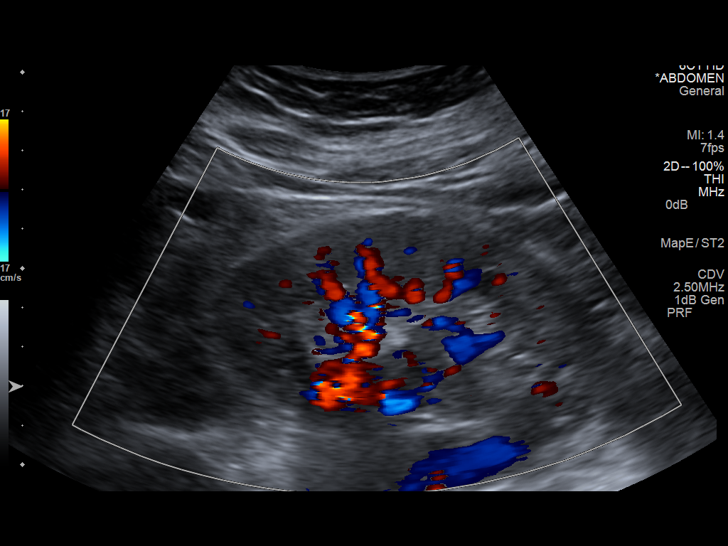
[im 60/72]
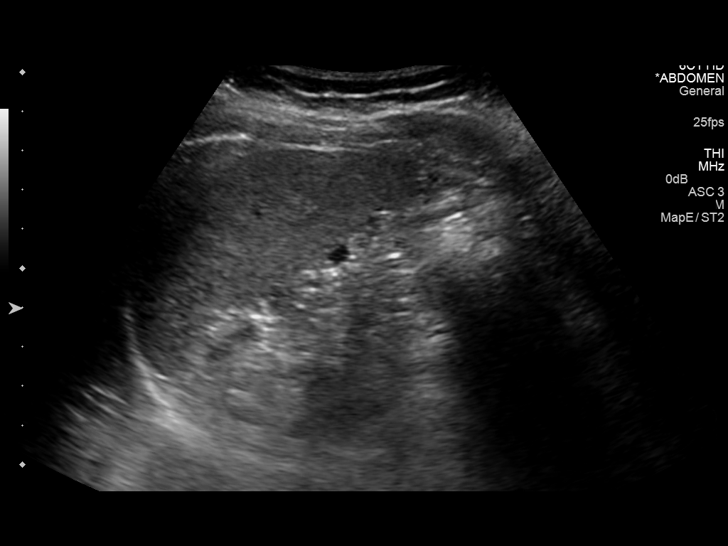
[im 66/72]
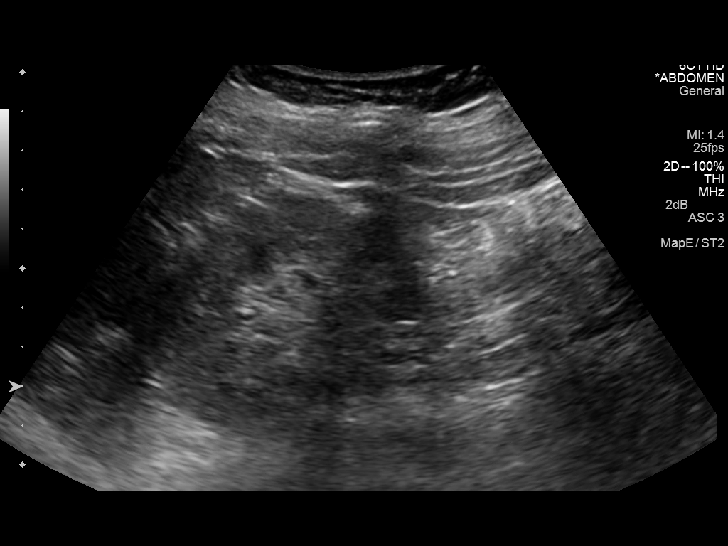
[im 72/72]
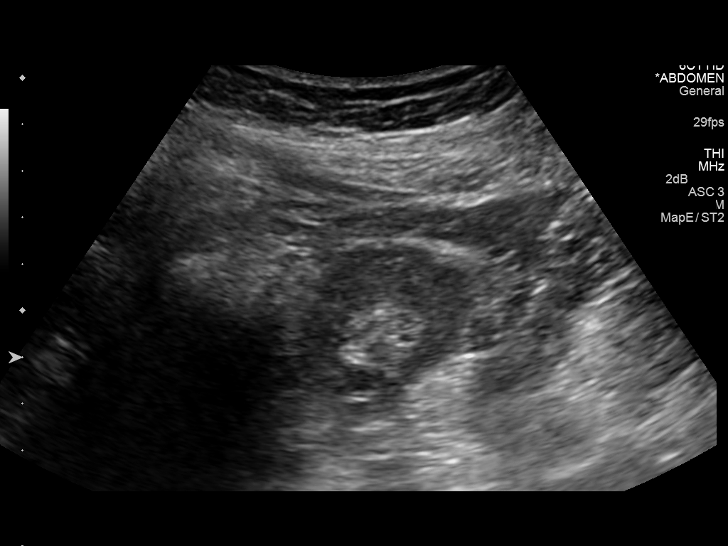

[14 of 25 positions shown; findings below may reference images not displayed]

FINDINGS: Gallbladder: The gallbladder is visualized and no gallstones are
noted. There is no pain over the gallbladder with compression.

Common bile duct: Diameter: The common bile duct is normal measuring
1.7 mm in diameter.

Liver: The liver has a normal echogenic pattern. No focal hepatic
abnormality is seen.

IVC: No abnormality visualized.

Pancreas: Visualized portion unremarkable.

Spleen: The spleen is normal measuring 6.7 cm.

Right Kidney: Length: 10.4 cm..  No hydronephrosis is seen.

Left Kidney: Length: 9.2 cm..  No hydronephrosis is noted.

Abdominal aorta: The abdominal aorta is normal caliber.

Other findings: None.
IMPRESSION: Negative abdominal ultrasound

## 2019-01-09 ENCOUNTER — Other Ambulatory Visit: Payer: Self-pay | Admitting: Obstetrics and Gynecology

## 2019-01-09 DIAGNOSIS — Z1231 Encounter for screening mammogram for malignant neoplasm of breast: Secondary | ICD-10-CM

## 2019-03-04 ENCOUNTER — Other Ambulatory Visit: Payer: Self-pay

## 2019-03-04 ENCOUNTER — Ambulatory Visit
Admission: RE | Admit: 2019-03-04 | Discharge: 2019-03-04 | Disposition: A | Discharge status: Home / Self Care | Payer: 59 | Source: Ambulatory Visit | Attending: Obstetrics and Gynecology | Admitting: Obstetrics and Gynecology

## 2019-03-04 DIAGNOSIS — Z1231 Encounter for screening mammogram for malignant neoplasm of breast: Secondary | ICD-10-CM

## 2019-10-22 ENCOUNTER — Ambulatory Visit: Payer: 59 | Admitting: Nurse Practitioner

## 2020-07-08 ENCOUNTER — Other Ambulatory Visit: Payer: Self-pay | Admitting: Obstetrics and Gynecology

## 2020-07-08 DIAGNOSIS — Z1231 Encounter for screening mammogram for malignant neoplasm of breast: Secondary | ICD-10-CM

## 2020-09-03 ENCOUNTER — Ambulatory Visit
Admission: RE | Admit: 2020-09-03 | Discharge: 2020-09-03 | Disposition: A | Discharge status: Home / Self Care | Payer: 59 | Source: Ambulatory Visit | Attending: Obstetrics and Gynecology | Admitting: Obstetrics and Gynecology

## 2020-09-03 ENCOUNTER — Other Ambulatory Visit: Payer: Self-pay

## 2020-09-03 DIAGNOSIS — Z1231 Encounter for screening mammogram for malignant neoplasm of breast: Secondary | ICD-10-CM

## 2021-10-04 ENCOUNTER — Other Ambulatory Visit: Payer: Self-pay | Admitting: Obstetrics and Gynecology

## 2021-10-04 DIAGNOSIS — Z1231 Encounter for screening mammogram for malignant neoplasm of breast: Secondary | ICD-10-CM

## 2021-10-14 ENCOUNTER — Ambulatory Visit: Payer: 59

## 2021-10-28 ENCOUNTER — Ambulatory Visit
Admission: RE | Admit: 2021-10-28 | Discharge: 2021-10-28 | Disposition: A | Discharge status: Home / Self Care | Payer: 59 | Source: Ambulatory Visit | Attending: Obstetrics and Gynecology | Admitting: Obstetrics and Gynecology

## 2021-10-28 DIAGNOSIS — Z1231 Encounter for screening mammogram for malignant neoplasm of breast: Secondary | ICD-10-CM

## 2023-07-14 ENCOUNTER — Other Ambulatory Visit (HOSPITAL_COMMUNITY): Payer: Self-pay | Admitting: Nephrology

## 2023-07-14 DIAGNOSIS — D351 Benign neoplasm of parathyroid gland: Secondary | ICD-10-CM

## 2023-07-25 ENCOUNTER — Ambulatory Visit (HOSPITAL_COMMUNITY)
Admission: RE | Admit: 2023-07-25 | Discharge: 2023-07-25 | Disposition: A | Discharge status: Home / Self Care | Source: Ambulatory Visit | Attending: Nephrology | Admitting: Nephrology

## 2023-07-25 DIAGNOSIS — D351 Benign neoplasm of parathyroid gland: Secondary | ICD-10-CM | POA: Diagnosis present

## 2023-07-25 MED ORDER — TECHNETIUM TC 99M SESTAMIBI - CARDIOLITE
25.5000 | Freq: Once | INTRAVENOUS | Status: AC | PRN
  Administered 2023-07-25: 25.5 via INTRAVENOUS

## 2024-01-10 ENCOUNTER — Ambulatory Visit: Payer: Self-pay | Admitting: General Surgery

## 2024-01-10 NOTE — Pre-Procedure Instructions (Signed)
 Surgical Instructions   Your procedure is scheduled on Tuesday, November 18th. Report to [facility] Main Entrance A at 05:30 A.M., then check in with the Admitting office. Any questions or running late day of surgery: call [phone]  Questions prior to your surgery date: call [phone], Monday-Friday, 8am-4pm. If you experience any cold or flu symptoms such as cough, fever, chills, shortness of breath, etc. between now and your scheduled surgery, please notify us  at the above number.     Remember:  Do not eat after midnight the night before your surgery   You may drink clear liquids until 04:30 AM the morning of your surgery.   Clear liquids allowed are: Water, Non-Citrus Juices (without pulp), Carbonated Beverages, Clear Tea (no milk, honey, etc.), Black Coffee Only (NO MILK, CREAM OR POWDERED CREAMER of any kind), and Gatorade.    Take these medicines the morning of surgery with A SIP OF WATER  TIADYLT ER     One week prior to surgery, STOP taking any Aspirin (unless otherwise instructed by your surgeon) Aleve, Naproxen, Ibuprofen, Motrin, Advil, Goody's, BC's, all herbal medications, fish oil, and non-prescription vitamins.  WHAT DO I DO ABOUT MY DIABETES MEDICATION?   Do not take metFORMIN (GLUCOPHAGE-XR) the morning of surgery.  STOP taking  JARDIANCE 72 hours prior to surgery. Last dose 11/14.      HOW TO MANAGE YOUR DIABETES BEFORE AND AFTER SURGERY  Why is it important to control my blood sugar before and after surgery? Improving blood sugar levels before and after surgery helps healing and can limit problems. A way of improving blood sugar control is eating a healthy diet by:  Eating less sugar and carbohydrates  Increasing activity/exercise  Talking with your doctor about reaching your blood sugar goals High blood sugars (greater than 180 mg/dL) can raise your risk of infections and slow your recovery, so you will need to focus on controlling your diabetes  during the weeks before surgery. Make sure that the doctor who takes care of your diabetes knows about your planned surgery including the date and location.  How do I manage my blood sugar before surgery? Check your blood sugar at least 4 times a day, starting 2 days before surgery, to make sure that the level is not too high or low.  Check your blood sugar the morning of your surgery when you wake up and every 2 hours until you get to the Short Stay unit.  If your blood sugar is less than 70 mg/dL, you will need to treat for low blood sugar: Do not take insulin. Treat a low blood sugar (less than 70 mg/dL) with  cup of clear juice (cranberry or apple), 4 glucose tablets, OR glucose gel. Recheck blood sugar in 15 minutes after treatment (to make sure it is greater than 70 mg/dL). If your blood sugar is not greater than 70 mg/dL on recheck, call [phone] for further instructions. Report your blood sugar to the short stay nurse when you get to Short Stay.  If you are admitted to the hospital after surgery: Your blood sugar will be checked by the staff and you will probably be given insulin after surgery (instead of oral diabetes medicines) to make sure you have good blood sugar levels. The goal for blood sugar control after surgery is 80-180 mg/dL.                     Do NOT Smoke (Tobacco/Vaping) for 24 hours prior to your  procedure.  If you use a CPAP at night, you may bring your mask/headgear for your overnight stay.   You will be asked to remove any contacts, glasses, piercing's, hearing aid's, dentures/partials prior to surgery. Please bring cases for these items if needed.    Patients discharged the day of surgery will not be allowed to drive home, and someone needs to stay with them for 24 hours.  SURGICAL WAITING ROOM VISITATION Patients may have no more than 2 support people in the waiting area - these visitors may rotate.   Pre-op nurse will coordinate an appropriate time for  1 ADULT support person, who may not rotate, to accompany patient in pre-op.  Children under the age of 37 must have an adult with them who is not the patient and must remain in the main waiting area with an adult.  If the patient needs to stay at the hospital during part of their recovery, the visitor guidelines for inpatient rooms apply.  Please refer to the [facility] website for the visitor guidelines for any additional information.   If you received a COVID test during your pre-op visit  it is requested that you wear a mask when out in public, stay away from anyone that may not be feeling well and notify your surgeon if you develop symptoms. If you have been in contact with anyone that has tested positive in the last 10 days please notify you surgeon.      Pre-operative CHG Bathing Instructions   You can play a key role in reducing the risk of infection after surgery. Your skin needs to be as free of germs as possible. You can reduce the number of germs on your skin by washing with CHG (chlorhexidine gluconate) soap before surgery. CHG is an antiseptic soap that kills germs and continues to kill germs even after washing.   DO NOT use if you have an allergy to chlorhexidine/CHG or antibacterial soaps. If your skin becomes reddened or irritated, stop using the CHG and notify one of our RNs at [phone].              TAKE A SHOWER THE NIGHT BEFORE SURGERY   Please keep in mind the following:  DO NOT shave, including legs and underarms, 48 hours prior to surgery.   You may shave your face before/day of surgery.  Place clean sheets on your bed the night before surgery Use a clean washcloth (not used since being washed) for shower. DO NOT sleep with pet's night before surgery.  CHG Shower Instructions:  Wash your face and private area with normal soap. If you choose to wash your hair, wash first with your normal shampoo.  After you use shampoo/soap, rinse your hair and body thoroughly  to remove shampoo/soap residue.  Turn the water OFF and apply half the bottle of CHG soap to a CLEAN washcloth.  Apply CHG soap ONLY FROM YOUR NECK DOWN TO YOUR TOES (washing for 3-5 minutes)  DO NOT use CHG soap on face, private areas, open wounds, or sores.  Pay special attention to the area where your surgery is being performed.  If you are having back surgery, having someone wash your back for you may be helpful. Wait 2 minutes after CHG soap is applied, then you may rinse off the CHG soap.  Pat dry with a clean towel  Put on clean pajamas    Additional instructions for the day of surgery: If you choose, you may shower the morning  of surgery with an antibacterial soap.  DO NOT APPLY any lotions, deodorants, cologne, or perfumes.   Do not wear jewelry or makeup Do not wear nail polish, gel polish, artificial nails, or any other type of covering on natural nails (fingers and toes) Do not bring valuables to the hospital. [facility] is not responsible for valuables/personal belongings. Put on clean/comfortable clothes.  Please brush your teeth.  Ask your nurse before applying any prescription medications to the skin.

## 2024-01-11 ENCOUNTER — Other Ambulatory Visit: Payer: Self-pay

## 2024-01-11 ENCOUNTER — Encounter (HOSPITAL_COMMUNITY)
Admission: RE | Admit: 2024-01-11 | Discharge: 2024-01-11 | Disposition: A | Discharge status: Home / Self Care | Source: Ambulatory Visit | Attending: General Surgery | Admitting: General Surgery

## 2024-01-11 ENCOUNTER — Encounter (HOSPITAL_COMMUNITY): Payer: Self-pay

## 2024-01-11 VITALS — BP 195/105 | HR 98 | Temp 97.8°F | Resp 18 | Ht 63.0 in | Wt 147.0 lb

## 2024-01-11 DIAGNOSIS — I10 Essential (primary) hypertension: Secondary | ICD-10-CM | POA: Diagnosis not present

## 2024-01-11 DIAGNOSIS — Z01812 Encounter for preprocedural laboratory examination: Secondary | ICD-10-CM | POA: Diagnosis present

## 2024-01-11 DIAGNOSIS — E213 Hyperparathyroidism, unspecified: Secondary | ICD-10-CM | POA: Insufficient documentation

## 2024-01-11 DIAGNOSIS — E785 Hyperlipidemia, unspecified: Secondary | ICD-10-CM | POA: Diagnosis not present

## 2024-01-11 DIAGNOSIS — Z79899 Other long term (current) drug therapy: Secondary | ICD-10-CM | POA: Insufficient documentation

## 2024-01-11 DIAGNOSIS — E1121 Type 2 diabetes mellitus with diabetic nephropathy: Secondary | ICD-10-CM | POA: Diagnosis not present

## 2024-01-11 DIAGNOSIS — Z01818 Encounter for other preprocedural examination: Secondary | ICD-10-CM

## 2024-01-11 HISTORY — DX: Type 2 diabetes mellitus without complications: E11.9

## 2024-01-11 LAB — CBC
HCT: 35.7 % — ABNORMAL LOW (ref 36.0–46.0)
Hemoglobin: 12 g/dL (ref 12.0–15.0)
MCH: 29.6 pg (ref 26.0–34.0)
MCHC: 33.6 g/dL (ref 30.0–36.0)
MCV: 88.1 fL (ref 80.0–100.0)
Platelets: 193 K/uL (ref 150–400)
RBC: 4.05 MIL/uL (ref 3.87–5.11)
RDW: 13.4 % (ref 11.5–15.5)
WBC: 6.9 K/uL (ref 4.0–10.5)
nRBC: 0 % (ref 0.0–0.2)

## 2024-01-11 LAB — GLUCOSE, CAPILLARY: Glucose-Capillary: 119 mg/dL — ABNORMAL HIGH (ref 70–99)

## 2024-01-11 NOTE — Progress Notes (Signed)
 Your procedure is scheduled on Tuesday, November 18th. Report to [facility] Main Entrance A at 9:50 A.M., then check in with the Admitting office. Any questions or running late day of surgery: call [phone]   Questions prior to your surgery date: call [phone], Monday-Friday, 8am-4pm. If you experience any cold or flu symptoms such as cough, fever, chills, shortness of breath, etc. between now and your scheduled surgery, please notify us  at the above number.            Remember:       Do not eat after midnight the night before your surgery     You may drink clear liquids until 08:50 AM the morning of your surgery.   Clear liquids allowed are: Water, Non-Citrus Juices (without pulp), Carbonated Beverages, Clear Tea (no milk, honey, etc.), Black Coffee Only (NO MILK, CREAM OR POWDERED CREAMER of any kind), and Gatorade.          Take these medicines the morning of surgery with A SIP OF WATER  TIADYLT ER        One week prior to surgery, STOP taking any Aspirin (unless otherwise instructed by your surgeon) Aleve, Naproxen, Ibuprofen, Motrin, Advil, Goody's, BC's, all herbal medications, fish oil, and non-prescription vitamins.   WHAT DO I DO ABOUT MY DIABETES MEDICATION?     Do not take metFORMIN (GLUCOPHAGE-XR) the morning of surgery.   STOP taking         JARDIANCE 72 hours prior to surgery. Last dose 11/14.                                  HOW TO MANAGE YOUR DIABETES BEFORE AND AFTER SURGERY   Why is it important to control my blood sugar before and after surgery? Improving blood sugar levels before and after surgery helps healing and can limit problems. A way of improving blood sugar control is eating a healthy diet by:  Eating less sugar and carbohydrates  Increasing activity/exercise  Talking with your doctor about reaching your blood sugar goals High blood sugars (greater than 180 mg/dL) can raise your risk of infections and slow your recovery, so you will need  to focus on controlling your diabetes during the weeks before surgery. Make sure that the doctor who takes care of your diabetes knows about your planned surgery including the date and location.   How do I manage my blood sugar before surgery? Check your blood sugar at least 4 times a day, starting 2 days before surgery, to make sure that the level is not too high or low.   Check your blood sugar the morning of your surgery when you wake up and every 2 hours until you get to the Short Stay unit.   If your blood sugar is less than 70 mg/dL, you will need to treat for low blood sugar: Do not take insulin. Treat a low blood sugar (less than 70 mg/dL) with  cup of clear juice (cranberry or apple), 4 glucose tablets, OR glucose gel. Recheck blood sugar in 15 minutes after treatment (to make sure it is greater than 70 mg/dL). If your blood sugar is not greater than 70 mg/dL on recheck, call [phone] for further instructions. Report your blood sugar to the short stay nurse when you get to Short Stay.   If you are admitted to the hospital after surgery: Your blood sugar will be checked by the staff  and you will probably be given insulin after surgery (instead of oral diabetes medicines) to make sure you have good blood sugar levels. The goal for blood sugar control after surgery is 80-180 mg/dL.                     Do NOT Smoke (Tobacco/Vaping) for 24 hours prior to your procedure.   If you use a CPAP at night, you may bring your mask/headgear for your overnight stay.   You will be asked to remove any contacts, glasses, piercing's, hearing aid's, dentures/partials prior to surgery. Please bring cases for these items if needed.    Patients discharged the day of surgery will not be allowed to drive home, and someone needs to stay with them for 24 hours.   SURGICAL WAITING ROOM VISITATION Patients may have no more than 2 support people in the waiting area - these visitors may rotate.   Pre-op  nurse will coordinate an appropriate time for 1 ADULT support person, who may not rotate, to accompany patient in pre-op.  Children under the age of 1 must have an adult with them who is not the patient and must remain in the main waiting area with an adult.   If the patient needs to stay at the hospital during part of their recovery, the visitor guidelines for inpatient rooms apply.   Please refer to the [facility] website for the visitor guidelines for any additional information.     If you received a COVID test during your pre-op visit  it is requested that you wear a mask when out in public, stay away from anyone that may not be feeling well and notify your surgeon if you develop symptoms. If you have been in contact with anyone that has tested positive in the last 10 days please notify you surgeon.         Pre-operative CHG Bathing Instructions    You can play a key role in reducing the risk of infection after surgery. Your skin needs to be as free of germs as possible. You can reduce the number of germs on your skin by washing with CHG (chlorhexidine gluconate) soap before surgery. CHG is an antiseptic soap that kills germs and continues to kill germs even after washing.    DO NOT use if you have an allergy to chlorhexidine/CHG or antibacterial soaps. If your skin becomes reddened or irritated, stop using the CHG and notify one of our RNs at [phone].               TAKE A SHOWER THE NIGHT BEFORE SURGERY    Please keep in mind the following:  DO NOT shave, including legs and underarms, 48 hours prior to surgery.   Place clean sheets on your bed the night before surgery Use a clean washcloth (not used since being washed) for shower. DO NOT sleep with pet's night before surgery.   CHG Shower Instructions:  Wash your face and private area with normal soap. If you choose to wash your hair, wash first with your normal shampoo.  After you use shampoo/soap, rinse your hair and body  thoroughly to remove shampoo/soap residue.  Turn the water OFF and apply half the bottle of CHG soap to a CLEAN washcloth.  Apply CHG soap ONLY FROM YOUR NECK DOWN TO YOUR TOES (washing for 3-5 minutes)  DO NOT use CHG soap on face, private areas, open wounds, or sores.  Pay special attention to the area  where your surgery is being performed.  If you are having back surgery, having someone wash your back for you may be helpful. Wait 2 minutes after CHG soap is applied, then you may rinse off the CHG soap.  Pat dry with a clean towel  Put on clean pajamas     Additional instructions for the day of surgery: If you choose, you may shower the morning of surgery with an antibacterial soap.  DO NOT APPLY any lotions, deodorants, or perfumes.   Do not wear jewelry or makeup Do not wear nail polish, gel polish, artificial nails, or any other type of covering on natural nails (fingers and toes) Do not bring valuables to the hospital. [facility] is not responsible for valuables/personal belongings. Put on clean/comfortable clothes.  Please brush your teeth.  Ask your nurse before applying any prescription medications to the skin.

## 2024-01-11 NOTE — Progress Notes (Signed)
 Patient has White Coat Syndrome and has talked about this with her physician and nephrologist.  She has consistently having high BP readings at the beginning of her appointments however they do trend down after the appt.   PCP - Dr. Lamarr Rotunda Cardiologist - Denies however is starting with Dr. Vinie Maxcy on 02-08-24 for cholesterol maintance.  PPM/ICD - Denies Device Orders - n/a Rep Notified - n/a  Chest x-ray - n/a EKG - 01-11-24 Stress Test - denies ECHO - denies Cardiac Cath - denies  Sleep Study - denies CPAP - n/a  Fasting Blood Sugar - 100-120 Checks Blood Sugar: Checks her blood sugar daily in the morning  Last dose of GLP1 agonist-  Denies GLP1 instructions: n/a  Blood Thinner Instructions: denies Aspirin Instructions:denies  ERAS Protcol - Clears until 0850 PRE-SURGERY Ensure or G2- G2  COVID TEST- n/a   Anesthesia review: Yes, HTN w/new EKG, DM  Patient denies shortness of breath, fever, cough and chest pain at PAT appointment. Patient denies any respiratory issues at this time.    All instructions explained to the patient, with a verbal understanding of the material. Patient agrees to go over the instructions while at home for a better understanding. Patient also instructed to self quarantine after being tested for COVID-19. The opportunity to ask questions was provided.

## 2024-01-12 NOTE — Anesthesia Preprocedure Evaluation (Addendum)
 Anesthesia Evaluation  Patient identified by MRN, date of birth, ID band Patient awake    Reviewed: Allergy & Precautions, NPO status , Patient's Chart, lab work & pertinent test results  Airway Mallampati: II  TM Distance: >3 FB Neck ROM: Full    Dental  (+) Teeth Intact, Dental Advisory Given   Pulmonary neg pulmonary ROS   breath sounds clear to auscultation       Cardiovascular hypertension, Pt. on medications and Pt. on home beta blockers  Rhythm:Regular Rate:Normal     Neuro/Psych negative neurological ROS  negative psych ROS   GI/Hepatic negative GI ROS, Neg liver ROS,,,  Endo/Other  diabetes, Type 2, Oral Hypoglycemic Agents    Renal/GU Renal disease     Musculoskeletal   Abdominal   Peds  Hematology  (+) Blood dyscrasia, anemia   Anesthesia Other Findings   Reproductive/Obstetrics                              Anesthesia Physical Anesthesia Plan  ASA: 2  Anesthesia Plan: General   Post-op Pain Management: Tylenol PO (pre-op)* and Toradol IV (intra-op)*   Induction: Intravenous  PONV Risk Score and Plan: 4 or greater and Ondansetron, Dexamethasone, Midazolam and Scopolamine patch - Pre-op  Airway Management Planned: Oral ETT  Additional Equipment: None  Intra-op Plan:   Post-operative Plan: Extubation in OR  Informed Consent: I have reviewed the patients History and Physical, chart, labs and discussed the procedure including the risks, benefits and alternatives for the proposed anesthesia with the patient or authorized representative who has indicated his/her understanding and acceptance.     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  Patient with well-documented history of whitecoat hypertension.  This is followed by her PCP Dr. Comer Rotunda.  She is maintained on lisinopril 20 mg daily and diltiazem hydrochloride 180 mg daily.  Per notes, blood pressure generally reasonably well-controlled at home, but significantly elevated in medical settings.  Recently however even home pressures have been somewhat elevated.  Last seen in follow-up 12/22/2023, blood pressure 199/112 at that time.  Most recent home blood pressure was noted to be 152/95 (prior office visit 06/19/2023 notes home blood pressure 138/82).   Dr. Rotunda stated that the patient's increased blood pressure may be related to hyperparathyroidism and recommended to follow-up after surgery to discuss any potential changes to management.  Blood pressure similarly elevated preadmission testing, 201/110 initially and 195/105 on recheck.  BMP 12/22/2023 in Care Everywhere reviewed, unremarkable, glucose 129, creatinine 0.89, sodium 138, potassium 4.4, calcium 10.59.  Full results in Care Everywhere.  CBC 01/11/2024 reviewed, unremarkable.  EKG 01/11/2024: NSR.  Rate 87.  )         Anesthesia Quick Evaluation

## 2024-01-12 NOTE — Progress Notes (Signed)
 Anesthesia Chart Review:  63 year old female with pertinent history including HLD, non-insulin-dependent DM2 (A1c 7.2 on 12/22/2023), membranous nephropathy (followed by nephrologist Dr. Jimmy), HTN with severe whitecoat hypertension.  Patient with well-documented history of whitecoat hypertension.  This is followed by her PCP Dr. Comer Rotunda.  She is maintained on lisinopril 20 mg daily and diltiazem hydrochloride 180 mg daily.  Per notes, blood pressure generally reasonably well-controlled at home, but significantly elevated in medical settings.  Recently however even home pressures have been somewhat elevated.  Last seen in follow-up 12/22/2023, blood pressure 199/112 at that time.  Most recent home blood pressure was noted to be 152/95 (prior office visit 06/19/2023 notes home blood pressure 138/82).   Dr. Rotunda stated that the patient's increased blood pressure may be related to hyperparathyroidism and recommended to follow-up after surgery to discuss any potential changes to management.  Blood pressure similarly elevated preadmission testing, 201/110 initially and 195/105 on recheck.  BMP 12/22/2023 in Care Everywhere reviewed, unremarkable, glucose 129, creatinine 0.89, sodium 138, potassium 4.4, calcium 10.59.  Full results in Care Everywhere.  CBC 01/11/2024 reviewed, unremarkable.  EKG 01/11/2024: NSR.  Rate 87.     Lynwood Geofm RIGGERS [facility] Short Stay Center/Anesthesiology Phone [phone] 01/12/2024 2:34 PM

## 2024-01-15 NOTE — H&P (Signed)
 Chief Complaint  Patient presents with   New Consultation      hyperparathyroidism      Subjective   Susan Haley is a 63 y.o. female who presents for New Consultation (hyperparathyroidism) HPI History of Present Illness Susan Haley is a 64 year old female with hyperparathyroidism who presents for evaluation of elevated calcium levels. She was referred by Dr. Marlee for evaluation of her parathyroid  gland issues.   Calcium levels were initially 11.4 mg/dL and decreased to 89.2 mg/dL after discontinuing furosemide two months ago. She has been taking vitamin D for the past three to four months. A sestamibi scan shows increased activity in the left upper parathyroid  gland, consistent with an adenoma. Parathyroid  hormone levels are elevated at 82 pg/mL, slightly decreasing to 80 pg/mL.   She is allergic to ibuprofen, which causes swelling and other reactions. Current medications include daily vitamin D, and she has discontinued furosemide for the past two months.   Review of Systems  Constitutional:  Negative for chills and fever.  HENT:  Negative for ear pain and sore throat.   Eyes:  Negative for pain and visual disturbance.  Respiratory:  Negative for cough and shortness of breath.   Cardiovascular:  Negative for chest pain and palpitations.  Gastrointestinal:  Negative for abdominal pain and vomiting.  Genitourinary:  Negative for dysuria and hematuria.  Musculoskeletal:  Negative for arthralgias and back pain.  Skin:  Negative for color change and rash.  Neurological:  Negative for seizures and syncope.  All other systems reviewed and are negative.     There is no problem list on file for this patient.           Outpatient Medications Prior to Visit  Medication Sig Dispense Refill   atorvastatin (LIPITOR) 10 MG tablet 1 tablet Orally Once a day; Duration: 90 days       FUROsemide (LASIX) 40 MG tablet 1 tablet Orally once a day if needed       glipiZIDE (GLUCOTROL XL) 5 MG XL  tablet 1 tablet with food Orally Once a day; Duration: 90 days       JARDIANCE 25 mg tablet Take 25 mg by mouth once daily       lisinopriL (ZESTRIL) 20 MG tablet 1 tablet Orally once a day; Duration: 90 days       metFORMIN (GLUCOPHAGE-XR) 500 MG XR tablet 2 tablet with evening meal Orally Once a day       potassium chloride (K-TAB) 20 mEq TbER ER tablet 3 tablets Orally once a day if needed        No facility-administered medications prior to visit.        Objective      Vitals:    08/28/23 0910  BP: (!) 208/121  Pulse: 101  Temp: 36.8 C (98.2 F)  SpO2: 98%  Weight: 67 kg (147 lb 9.6 oz)  Height: 162.6 cm (5' 4)  PainSc: 0-No pain  PainLoc: Neck    Body mass index is 25.34 kg/m.   Home Vitals:      Physical Exam Physical Exam     PE:   Constitutional: No acute distress, conversant, appears states age. Eyes: Anicteric sclerae, moist conjunctiva, no lid lag Lungs: Clear to auscultation bilaterally, normal respiratory effort CV: regular rate and rhythm, no murmurs, no peripheral edema, pedal pulses 2+ GI: Soft, no masses or hepatosplenomegaly, non-tender to palpation Skin: No rashes, palpation reveals normal turgor Psychiatric: appropriate judgment and insight, oriented to person, place,  and time   Results LABS Calcium: 10.7 (08/14/2023) Calcium: 11.2 (08/07/2023) PTH: 80 (08/14/2023) PTH: 82 (08/07/2023)   RADIOLOGY Sestamibi scan: Increased activity in the left upper parathyroid  gland consistent with adenoma       Assessment/Plan:    Assessment & Plan Primary hyperparathyroidism   Primary hyperparathyroidism is present with elevated PTH and fluctuating calcium levels. A Sestamibi scan indicates a left upper parathyroid  adenoma. Surgery is recommended to prevent complications. Schedule parathyroidectomy for November, before Thanksgiving. Order repeat calcium and PTH levels in one month. Educational materials on parathyroid  adenoma were provided.    Chronic kidney disease   Chronic kidney disease is under monitoring with no acute issues.   Hypertension   Hypertension is under management with no acute issues.   Type 2 diabetes mellitus   Type 2 diabetes mellitus is under management with no acute issues.   History of hysterectomy   History of hysterectomy with no current issues. . Diagnoses and all orders for this visit:   Primary hyperparathyroidism (CMS/HHS-HCC)

## 2024-01-16 ENCOUNTER — Ambulatory Visit (HOSPITAL_BASED_OUTPATIENT_CLINIC_OR_DEPARTMENT_OTHER): Admitting: Certified Registered Nurse Anesthetist

## 2024-01-16 ENCOUNTER — Ambulatory Visit (HOSPITAL_COMMUNITY): Payer: Self-pay | Admitting: Physician Assistant

## 2024-01-16 ENCOUNTER — Encounter (HOSPITAL_COMMUNITY): Admission: RE | Disposition: A | Payer: Self-pay | Source: Home / Self Care | Attending: General Surgery

## 2024-01-16 ENCOUNTER — Other Ambulatory Visit: Payer: Self-pay

## 2024-01-16 ENCOUNTER — Observation Stay (HOSPITAL_COMMUNITY)
Admission: RE | Admit: 2024-01-16 | Discharge: 2024-01-17 | Disposition: A | Discharge status: Home / Self Care | Attending: General Surgery | Admitting: General Surgery

## 2024-01-16 ENCOUNTER — Encounter (HOSPITAL_COMMUNITY): Payer: Self-pay | Admitting: General Surgery

## 2024-01-16 DIAGNOSIS — Z01818 Encounter for other preprocedural examination: Secondary | ICD-10-CM

## 2024-01-16 DIAGNOSIS — D351 Benign neoplasm of parathyroid gland: Secondary | ICD-10-CM | POA: Diagnosis not present

## 2024-01-16 DIAGNOSIS — Z90710 Acquired absence of both cervix and uterus: Secondary | ICD-10-CM | POA: Diagnosis not present

## 2024-01-16 DIAGNOSIS — Z9089 Acquired absence of other organs: Principal | ICD-10-CM

## 2024-01-16 DIAGNOSIS — N189 Chronic kidney disease, unspecified: Secondary | ICD-10-CM | POA: Diagnosis not present

## 2024-01-16 DIAGNOSIS — Z9889 Other specified postprocedural states: Principal | ICD-10-CM

## 2024-01-16 DIAGNOSIS — Z7984 Long term (current) use of oral hypoglycemic drugs: Secondary | ICD-10-CM | POA: Insufficient documentation

## 2024-01-16 DIAGNOSIS — E210 Primary hyperparathyroidism: Secondary | ICD-10-CM

## 2024-01-16 DIAGNOSIS — E213 Hyperparathyroidism, unspecified: Secondary | ICD-10-CM | POA: Diagnosis present

## 2024-01-16 DIAGNOSIS — I129 Hypertensive chronic kidney disease with stage 1 through stage 4 chronic kidney disease, or unspecified chronic kidney disease: Secondary | ICD-10-CM | POA: Insufficient documentation

## 2024-01-16 DIAGNOSIS — Z79899 Other long term (current) drug therapy: Secondary | ICD-10-CM | POA: Diagnosis not present

## 2024-01-16 DIAGNOSIS — E1122 Type 2 diabetes mellitus with diabetic chronic kidney disease: Secondary | ICD-10-CM | POA: Diagnosis not present

## 2024-01-16 HISTORY — PX: PARATHYROIDECTOMY: SHX19

## 2024-01-16 HISTORY — DX: Chronic kidney disease, unspecified: N18.9

## 2024-01-16 LAB — GLUCOSE, CAPILLARY
Glucose-Capillary: 143 mg/dL — ABNORMAL HIGH (ref 70–99)
Glucose-Capillary: 141 mg/dL — ABNORMAL HIGH (ref 70–99)

## 2024-01-16 SURGERY — PARATHYROIDECTOMY
Anesthesia: General | Site: Neck | Laterality: Left

## 2024-01-16 MED ORDER — FUROSEMIDE 40 MG PO TABS: 40.0000 mg | ORAL_TABLET | Freq: Every day | ORAL | Status: DC | PRN

## 2024-01-16 MED ORDER — LISINOPRIL 20 MG PO TABS
20.0000 mg | ORAL_TABLET | Freq: Every day | ORAL | Status: DC
  Administered 2024-01-16: 20 mg via ORAL
  Filled 2024-01-16: qty 1

## 2024-01-16 MED ORDER — CHLORHEXIDINE GLUCONATE CLOTH 2 % EX PADS: 6.0000 | MEDICATED_PAD | Freq: Once | CUTANEOUS | Status: DC

## 2024-01-16 MED ORDER — 0.9 % SODIUM CHLORIDE (POUR BTL) OPTIME
TOPICAL | Status: DC | PRN
  Administered 2024-01-16: 1000 mL

## 2024-01-16 MED ORDER — FERREX 150 FORTE PLUS 50-100 MG PO CAPS: 1.0000 | ORAL_CAPSULE | Freq: Every day | ORAL | Status: DC

## 2024-01-16 MED ORDER — EMPAGLIFLOZIN 25 MG PO TABS
25.0000 mg | ORAL_TABLET | Freq: Every day | ORAL | Status: DC
  Administered 2024-01-16: 25 mg via ORAL
  Filled 2024-01-16 (×2): qty 1

## 2024-01-16 MED ORDER — CHLORHEXIDINE GLUCONATE 0.12 % MT SOLN
15.0000 mL | Freq: Once | OROMUCOSAL | Status: AC
  Administered 2024-01-16: 15 mL via OROMUCOSAL
  Filled 2024-01-16: qty 15

## 2024-01-16 MED ORDER — SCOPOLAMINE 1 MG/3DAYS TD PT72
1.0000 | MEDICATED_PATCH | TRANSDERMAL | Status: DC
  Administered 2024-01-16: 1 via TRANSDERMAL

## 2024-01-16 MED ORDER — DILTIAZEM HCL ER COATED BEADS 180 MG PO CP24: 180.0000 mg | ORAL_CAPSULE | Freq: Every morning | ORAL | Status: DC

## 2024-01-16 MED ORDER — OXYCODONE HCL 5 MG/5ML PO SOLN: 5.0000 mg | Freq: Once | ORAL | Status: DC | PRN

## 2024-01-16 MED ORDER — MAGNESIUM OXIDE -MG SUPPLEMENT 400 (240 MG) MG PO TABS
400.0000 mg | ORAL_TABLET | Freq: Two times a day (BID) | ORAL | Status: DC
  Administered 2024-01-16: 400 mg via ORAL
  Filled 2024-01-16: qty 1

## 2024-01-16 MED ORDER — ONDANSETRON 4 MG PO TBDP: 4.0000 mg | ORAL_TABLET | Freq: Four times a day (QID) | ORAL | Status: DC | PRN

## 2024-01-16 MED ORDER — LACTATED RINGERS IV SOLN: INTRAVENOUS | Status: DC

## 2024-01-16 MED ORDER — CEFAZOLIN SODIUM-DEXTROSE 2-4 GM/100ML-% IV SOLN
2.0000 g | INTRAVENOUS | Status: AC
  Administered 2024-01-16: 2 g via INTRAVENOUS
  Filled 2024-01-16: qty 100

## 2024-01-16 MED ORDER — LIDOCAINE 2% (20 MG/ML) 5 ML SYRINGE
INTRAMUSCULAR | Status: DC | PRN
  Administered 2024-01-16: 40 mg via INTRAVENOUS

## 2024-01-16 MED ORDER — PROPOFOL 10 MG/ML IV BOLUS
INTRAVENOUS | Status: DC | PRN
  Administered 2024-01-16: 140 mg via INTRAVENOUS

## 2024-01-16 MED ORDER — MIDAZOLAM HCL (PF) 2 MG/2ML IJ SOLN
INTRAMUSCULAR | Status: DC | PRN
  Administered 2024-01-16: 2 mg via INTRAVENOUS

## 2024-01-16 MED ORDER — FENTANYL CITRATE (PF) 100 MCG/2ML IJ SOLN
25.0000 ug | INTRAMUSCULAR | Status: DC | PRN
  Administered 2024-01-16 (×2): 25 ug via INTRAVENOUS

## 2024-01-16 MED ORDER — DROPERIDOL 2.5 MG/ML IJ SOLN: 0.6250 mg | Freq: Once | INTRAMUSCULAR | Status: DC | PRN

## 2024-01-16 MED ORDER — METOPROLOL TARTRATE 5 MG/5ML IV SOLN: 5.0000 mg | Freq: Four times a day (QID) | INTRAVENOUS | Status: DC | PRN

## 2024-01-16 MED ORDER — MIDAZOLAM HCL 2 MG/2ML IJ SOLN
INTRAMUSCULAR | Status: AC
  Filled 2024-01-16: qty 2

## 2024-01-16 MED ORDER — ACETAMINOPHEN 10 MG/ML IV SOLN
1000.0000 mg | Freq: Once | INTRAVENOUS | Status: DC | PRN
  Administered 2024-01-16: 1000 mg via INTRAVENOUS

## 2024-01-16 MED ORDER — POTASSIUM CHLORIDE CRYS ER 20 MEQ PO TBCR: 60.0000 meq | EXTENDED_RELEASE_TABLET | Freq: Every day | ORAL | Status: DC | PRN

## 2024-01-16 MED ORDER — POLYSACCHARIDE IRON COMPLEX 150 MG PO CAPS
150.0000 mg | ORAL_CAPSULE | Freq: Every day | ORAL | Status: DC
  Administered 2024-01-16: 150 mg via ORAL
  Filled 2024-01-16 (×2): qty 1

## 2024-01-16 MED ORDER — DEXTROSE-SODIUM CHLORIDE 5-0.9 % IV SOLN: INTRAVENOUS | Status: DC

## 2024-01-16 MED ORDER — PHENYLEPHRINE HCL-NACL 20-0.9 MG/250ML-% IV SOLN
INTRAVENOUS | Status: DC | PRN
Start: 2024-01-16 — End: 2024-01-16
  Administered 2024-01-16: 15 ug/min via INTRAVENOUS

## 2024-01-16 MED ORDER — HYDROMORPHONE HCL 1 MG/ML IJ SOLN: 1.0000 mg | INTRAMUSCULAR | Status: DC | PRN

## 2024-01-16 MED ORDER — BUPIVACAINE-EPINEPHRINE (PF) 0.25% -1:200000 IJ SOLN
INTRAMUSCULAR | Status: AC
  Filled 2024-01-16: qty 30

## 2024-01-16 MED ORDER — INSULIN ASPART 100 UNIT/ML IJ SOLN: 0.0000 [IU] | INTRAMUSCULAR | Status: DC | PRN

## 2024-01-16 MED ORDER — ACETAMINOPHEN 10 MG/ML IV SOLN
INTRAVENOUS | Status: AC
  Filled 2024-01-16: qty 100

## 2024-01-16 MED ORDER — FENTANYL CITRATE (PF) 100 MCG/2ML IJ SOLN
INTRAMUSCULAR | Status: AC
  Filled 2024-01-16: qty 2

## 2024-01-16 MED ORDER — ROCURONIUM BROMIDE 10 MG/ML (PF) SYRINGE
PREFILLED_SYRINGE | INTRAVENOUS | Status: DC | PRN
  Administered 2024-01-16: 50 mg via INTRAVENOUS

## 2024-01-16 MED ORDER — BUPIVACAINE-EPINEPHRINE (PF) 0.25% -1:200000 IJ SOLN
INTRAMUSCULAR | Status: DC | PRN
  Administered 2024-01-16: 10 mL

## 2024-01-16 MED ORDER — LIDOCAINE 2% (20 MG/ML) 5 ML SYRINGE
INTRAMUSCULAR | Status: AC
  Filled 2024-01-16: qty 5

## 2024-01-16 MED ORDER — METFORMIN HCL ER 500 MG PO TB24
500.0000 mg | ORAL_TABLET | Freq: Two times a day (BID) | ORAL | Status: DC
  Administered 2024-01-17: 500 mg via ORAL
  Filled 2024-01-16: qty 1

## 2024-01-16 MED ORDER — ONDANSETRON HCL 4 MG/2ML IJ SOLN
INTRAMUSCULAR | Status: AC
  Filled 2024-01-16: qty 2

## 2024-01-16 MED ORDER — OYSTER SHELL CALCIUM/D3 500-5 MG-MCG PO TABS
2.0000 | ORAL_TABLET | Freq: Three times a day (TID) | ORAL | Status: DC
  Administered 2024-01-16: 2 via ORAL
  Filled 2024-01-16: qty 2

## 2024-01-16 MED ORDER — ENSURE PRE-SURGERY PO LIQD: 296.0000 mL | Freq: Once | ORAL | Status: DC

## 2024-01-16 MED ORDER — TRAMADOL HCL 50 MG PO TABS: 50.0000 mg | ORAL_TABLET | Freq: Four times a day (QID) | ORAL | Status: DC | PRN

## 2024-01-16 MED ORDER — ONDANSETRON HCL 4 MG/2ML IJ SOLN: 4.0000 mg | Freq: Four times a day (QID) | INTRAMUSCULAR | Status: DC | PRN

## 2024-01-16 MED ORDER — FENTANYL CITRATE (PF) 100 MCG/2ML IJ SOLN
INTRAMUSCULAR | Status: DC | PRN
  Administered 2024-01-16 (×2): 50 ug via INTRAVENOUS

## 2024-01-16 MED ORDER — OXYCODONE HCL 5 MG PO TABS: 5.0000 mg | ORAL_TABLET | Freq: Once | ORAL | Status: DC | PRN

## 2024-01-16 MED ORDER — DEXAMETHASONE SOD PHOSPHATE PF 10 MG/ML IJ SOLN
4.0000 mg | INTRAMUSCULAR | Status: AC
  Administered 2024-01-16: 10 mg via INTRAVENOUS

## 2024-01-16 MED ORDER — HEMOSTATIC AGENTS (NO CHARGE) OPTIME
TOPICAL | Status: DC | PRN
  Administered 2024-01-16: 1 via TOPICAL

## 2024-01-16 MED ORDER — ORAL CARE MOUTH RINSE: 15.0000 mL | Freq: Once | OROMUCOSAL | Status: AC

## 2024-01-16 MED ORDER — SUGAMMADEX SODIUM 200 MG/2ML IV SOLN
INTRAVENOUS | Status: DC | PRN
  Administered 2024-01-16: 200 mg via INTRAVENOUS

## 2024-01-16 MED ORDER — HYDROCODONE-ACETAMINOPHEN 5-325 MG PO TABS
1.0000 | ORAL_TABLET | ORAL | Status: DC | PRN
  Filled 2024-01-16: qty 2

## 2024-01-16 MED ORDER — HYOSCYAMINE SULFATE 0.125 MG SL SUBL: 0.1250 mg | SUBLINGUAL_TABLET | SUBLINGUAL | Status: DC | PRN

## 2024-01-16 MED ORDER — ONDANSETRON HCL 4 MG/2ML IJ SOLN
INTRAMUSCULAR | Status: DC | PRN
  Administered 2024-01-16: 4 mg via INTRAVENOUS

## 2024-01-16 MED ORDER — ROCURONIUM BROMIDE 10 MG/ML (PF) SYRINGE
PREFILLED_SYRINGE | INTRAVENOUS | Status: AC
  Filled 2024-01-16: qty 10

## 2024-01-16 MED ORDER — ACETAMINOPHEN 500 MG PO TABS
1000.0000 mg | ORAL_TABLET | ORAL | Status: AC
  Administered 2024-01-16: 1000 mg via ORAL
  Filled 2024-01-16: qty 2

## 2024-01-16 SURGICAL SUPPLY — 41 items
BAG COUNTER SPONGE SURGICOUNT (BAG) ×1 IMPLANT
CANISTER SUCTION 3000ML PPV (SUCTIONS) ×1 IMPLANT
CHLORAPREP W/TINT 26 (MISCELLANEOUS) ×1 IMPLANT
CLIP TI MEDIUM 6 (CLIP) ×1 IMPLANT
CLIP TI WIDE RED SMALL 24 (CLIP) IMPLANT
CLIP TI WIDE RED SMALL 6 (CLIP) ×1 IMPLANT
CNTNR URN SCR LID CUP LEK RST (MISCELLANEOUS) ×1 IMPLANT
COVER SURGICAL LIGHT HANDLE (MISCELLANEOUS) ×1 IMPLANT
DERMABOND ADVANCED .7 DNX12 (GAUZE/BANDAGES/DRESSINGS) IMPLANT
DERMABOND ADVANCED .7 DNX6 (GAUZE/BANDAGES/DRESSINGS) ×1 IMPLANT
DISSECTOR SURG LIGASURE 21 (MISCELLANEOUS) ×1 IMPLANT
DRAPE LAPAROTOMY 100X72 PEDS (DRAPES) ×1 IMPLANT
DRAPE SLUSH/WARMER DISC (DRAPES) IMPLANT
ELECT COATED BLADE 2.86 ST (ELECTRODE) ×1 IMPLANT
ELECTRODE REM PT RTRN 9FT ADLT (ELECTROSURGICAL) ×1 IMPLANT
GAUZE 4X4 16PLY ~~LOC~~+RFID DBL (SPONGE) ×1 IMPLANT
GAUZE SPONGE 2X2 8PLY STRL LF (GAUZE/BANDAGES/DRESSINGS) ×1 IMPLANT
GLOVE BIO SURGEON STRL SZ7.5 (GLOVE) ×2 IMPLANT
GLOVE BIOGEL PI IND STRL 8 (GLOVE) IMPLANT
GOWN STRL REUS W/ TWL LRG LVL3 (GOWN DISPOSABLE) ×2 IMPLANT
HEMOSTAT SURGICEL 2X4 FIBR (HEMOSTASIS) ×1 IMPLANT
ILLUMINATOR WAVEGUIDE N/F (MISCELLANEOUS) ×1 IMPLANT
KIT BASIN OR (CUSTOM PROCEDURE TRAY) ×1 IMPLANT
KIT TURNOVER KIT B (KITS) ×1 IMPLANT
NDL HYPO 25GX1X1/2 BEV (NEEDLE) IMPLANT
NEEDLE HYPO 25GX1X1/2 BEV (NEEDLE) ×1 IMPLANT
PACK GENERAL/GYN (CUSTOM PROCEDURE TRAY) ×1 IMPLANT
PAD ARMBOARD POSITIONER FOAM (MISCELLANEOUS) ×2 IMPLANT
PAD MAGNETIC INSTR ST 16X20 (MISCELLANEOUS) IMPLANT
PENCIL SMOKE EVACUATOR (MISCELLANEOUS) ×1 IMPLANT
SHEARS HARMONIC 9CM CVD (BLADE) IMPLANT
SOLN 0.9% NACL POUR BTL 1000ML (IV SOLUTION) ×1 IMPLANT
SPONGE INTESTINAL PEANUT (DISPOSABLE) ×1 IMPLANT
STRIP CLOSURE SKIN 1/2X4 (GAUZE/BANDAGES/DRESSINGS) ×1 IMPLANT
SUT MNCRL AB 4-0 PS2 18 (SUTURE) ×1 IMPLANT
SUT SILK 2-0 18XBRD TIE 12 (SUTURE) IMPLANT
SUT SILK 3-0 18XBRD TIE 12 (SUTURE) IMPLANT
SUT VIC AB 3-0 SH 18 (SUTURE) ×1 IMPLANT
SYR CONTROL 10ML LL (SYRINGE) IMPLANT
TOWEL GREEN STERILE (TOWEL DISPOSABLE) ×1 IMPLANT
TOWEL GREEN STERILE FF (TOWEL DISPOSABLE) ×1 IMPLANT

## 2024-01-16 NOTE — Transfer of Care (Signed)
 Immediate Anesthesia Transfer of Care Note  Patient: Susan Haley  Procedure(s) Performed: PARATHYROIDECTOMY (Left: Neck)  Patient Location: PACU  Anesthesia Type:General  Level of Consciousness: drowsy  Airway & Oxygen Therapy: Patient Spontanous Breathing and Patient connected to face mask oxygen  Post-op Assessment: Report given to RN, Post -op Vital signs reviewed and stable, and Patient moving all extremities  Post vital signs: Reviewed and stable  Last Vitals:  Vitals Value Taken Time  BP 168/99 01/16/24 12:45  Temp    Pulse 95 01/16/24 12:46  Resp 14 01/16/24 12:46  SpO2 99 % 01/16/24 12:46  Vitals shown include unfiled device data.  Last Pain:  Vitals:   01/16/24 1036  TempSrc:   PainSc: 0-No pain         Complications: There were no known notable events for this encounter.

## 2024-01-16 NOTE — Anesthesia Postprocedure Evaluation (Signed)
 Anesthesia Post Note  Patient: Susan Haley  Procedure(s) Performed: PARATHYROIDECTOMY (Left: Neck)     Patient location during evaluation: PACU Anesthesia Type: General Level of consciousness: awake and alert Pain management: pain level controlled Vital Signs Assessment: post-procedure vital signs reviewed and stable Respiratory status: spontaneous breathing, nonlabored ventilation, respiratory function stable and patient connected to nasal cannula oxygen Cardiovascular status: blood pressure returned to baseline and stable Postop Assessment: no apparent nausea or vomiting Anesthetic complications: no   There were no known notable events for this encounter.  Last Vitals:  Vitals:   01/16/24 1545 01/16/24 1600  BP: (!) 142/85 (!) 140/81  Pulse: 78 84  Resp: 19 18  Temp:    SpO2: 97% 98%    Last Pain:  Vitals:   01/16/24 1036  TempSrc:   PainSc: 0-No pain                 Franky JONETTA Bald

## 2024-01-16 NOTE — Interval H&P Note (Signed)
 History and Physical Interval Note:  01/16/2024 10:38 AM  Susan Haley  has presented today for surgery, with the diagnosis of PRIMARY HYPERPARATHYROIDISM.  The various methods of treatment have been discussed with the patient and family. After consideration of risks, benefits and other options for treatment, the patient has consented to  Procedure(s) with comments: PARATHYROIDECTOMY (Left) - LEFT MINIMUM INVASIVE PARATHYROIDECTOMY as a surgical intervention.  The patient's history has been reviewed, patient examined, no change in status, stable for surgery.  I have reviewed the patient's chart and labs.  Questions were answered to the patient's satisfaction.     Dejour Vos

## 2024-01-16 NOTE — Op Note (Signed)
 01/16/2024  12:27 PM  PATIENT:  Susan Haley  63 y.o. female  PRE-OPERATIVE DIAGNOSIS:  PRIMARY HYPERPARATHYROIDISM  POST-OPERATIVE DIAGNOSIS:  PRIMARY HYPERPARATHYROIDISM  PROCEDURE:  Procedure(s) with comments: PARATHYROIDECTOMY (Left superior) - MINIMUM INVASIVE  SURGEON:  Surgeons and Role:    DEWAINE Rubin Calamity, MD - Primary  ASSISTANTS: Waddell Collier, RNFA   ANESTHESIA:   local and general  EBL:  minimal   BLOOD ADMINISTERED:none  DRAINS: none   LOCAL MEDICATIONS USED:  BUPIVICAINE   SPECIMEN:  Source of Specimen:  Left superior parathyroid   DISPOSITION OF SPECIMEN:  PATHOLOGY  COUNTS:  YES  TOURNIQUET:  * No tourniquets in log *  DICTATION: .Dragon Dictation Details of the procedure:  The patient was taken back to the operating room. The patient was placed in supine position with bilateral SCDs in place.  The patient was prepped and draped in the usual sterile fashion. After appropriate anitbiotics were confirmed, a time-out was confirmed and all facts were verified. A left-sided 3 cm incision was made approximately 2 fingerbreadths above the sternal notch. Bovie cautery was used to maintain hemostasis dissection was carried down through the platysma. The platysma was elevated and flaps were created superiorly and inferiorly to the thyroid cartilage as well as the sternal notch, repsectively. The strap muscles were identified in the midline and separated. left-sided strap muscles were elevated off the anterior surface of the thyroid. This dissection was carried laterally. We proceeded to dissect away the left superior thyroid lobe with Kitners from the surrounding musculature from the thyroid. The superior vessels were ligated.  Upon dissecting the left superior lobe and reflecting it medially, an enlarge parathyroid  gland was identified.  This was dissected out from the surrounding tissue.  Once it was removed it was sent to Pathology for a frozen section confirmation.   Intraoperatively, Pathology confirmed that the specimen was indeed hypercellular parathyroid  tissue.  The area was irrigated out. The dissection bed was hemostatic. We placed fibrillar hemostatic agent into the wound. Strap muscles were then reapproximated in the midline with interrupted 3-0 Vicryl stitches. The platysma was reapproximated using 3-0 Vicryl stitches in interrupted fashion. Skin was then reapproximated using a running subcuticular 4-0 Monocryl. The skin was then dressed with Dermabond.   The patient was taken to the recovery room in stable condition.    PLAN OF CARE: Admit for overnight observation  PATIENT DISPOSITION:  PACU - hemodynamically stable.   Delay start of Pharmacological VTE agent (>24hrs) due to surgical blood loss or risk of bleeding: yes

## 2024-01-16 NOTE — Anesthesia Procedure Notes (Signed)
 Procedure Name: Intubation Date/Time: 01/16/2024 11:27 AM  Performed by: Vertie Russell NOVAK, CRNAPre-anesthesia Checklist: Patient identified, Emergency Drugs available, Suction available and Patient being monitored Patient Re-evaluated:Patient Re-evaluated prior to induction Oxygen Delivery Method: Circle system utilized Preoxygenation: Pre-oxygenation with 100% oxygen Induction Type: IV induction Ventilation: Mask ventilation without difficulty and Oral airway inserted - appropriate to patient size Laryngoscope Size: Mac and 3 Grade View: Grade II Tube type: Oral Tube size: 7.0 mm Number of attempts: 1 Airway Equipment and Method: Stylet and Oral airway Placement Confirmation: ETT inserted through vocal cords under direct vision, positive ETCO2 and breath sounds checked- equal and bilateral Secured at: 21 cm Tube secured with: Tape Dental Injury: Teeth and Oropharynx as per pre-operative assessment

## 2024-01-16 NOTE — Progress Notes (Signed)
 Notified Dr. Tilford of pt's HR and BP. No new orders at this time. Pt has extreme anxiety and attributes elevated HR and BP to that. Will continue to monitor. Pt CBG 143, holding protocol dose per Dr. Tilford. Will continue to monitor and will reevaluate in PACU per Dr. Tilford.   Joesph LOISE Stake, RN

## 2024-01-17 ENCOUNTER — Encounter (HOSPITAL_COMMUNITY): Payer: Self-pay | Admitting: General Surgery

## 2024-01-17 DIAGNOSIS — D351 Benign neoplasm of parathyroid gland: Secondary | ICD-10-CM | POA: Diagnosis not present

## 2024-01-17 LAB — BASIC METABOLIC PANEL WITH GFR
Anion gap: 14 (ref 5–15)
BUN: 18 mg/dL (ref 8–23)
CO2: 21 mmol/L — ABNORMAL LOW (ref 22–32)
Calcium: 10.3 mg/dL (ref 8.9–10.3)
Chloride: 103 mmol/L (ref 98–111)
Creatinine, Ser: 1.13 mg/dL — ABNORMAL HIGH (ref 0.44–1.00)
GFR, Estimated: 55 mL/min — ABNORMAL LOW (ref >60)
Glucose, Bld: 150 mg/dL — ABNORMAL HIGH (ref 70–99)
Potassium: 4.3 mmol/L (ref 3.5–5.1)
Sodium: 138 mmol/L (ref 135–145)

## 2024-01-17 LAB — MAGNESIUM: Magnesium: 2.1 mg/dL (ref 1.7–2.4)

## 2024-01-17 MED ORDER — CALCIUM CARBONATE ANTACID 500 MG PO CHEW: 3.0000 | CHEWABLE_TABLET | Freq: Three times a day (TID) | ORAL | 3 refills | Status: AC

## 2024-01-17 MED ORDER — ACETAMINOPHEN 500 MG PO TABS
1000.0000 mg | ORAL_TABLET | Freq: Four times a day (QID) | ORAL | Status: DC | PRN
  Administered 2024-01-17: 1000 mg via ORAL
  Filled 2024-01-17: qty 2

## 2024-01-17 MED ORDER — MAGNESIUM OXIDE 400 MG PO TABS: 400.0000 mg | ORAL_TABLET | Freq: Two times a day (BID) | ORAL | 1 refills | Status: AC

## 2024-01-17 NOTE — Plan of Care (Signed)
   Problem: Education: Goal: Knowledge of General Education information will improve Description Including pain rating scale, medication(s)/side effects and non-pharmacologic comfort measures Outcome: Progressing

## 2024-01-17 NOTE — Discharge Summary (Signed)
 Physician Discharge Summary  Patient ID: Susan Haley MRN: [id number] DOB/AGE: 1960-06-06 63 y.o.  Admit date: 01/16/2024 Discharge date: 01/17/2024  Admission Diagnoses:hyperparathyroidism  Discharge Diagnoses:  Principal Problem:   S/P parathyroidectomy   Discharged Condition: good  Hospital Course: PT admitted s/p parathyroidectomy.  PLease see op note for full details.  Pt did well post op and had good pain control.  Pt tol PO well.  Pt with no issues and was amb well.  Post op Day 1 labs were WNL.  She was deemed stable for DC and DC'd home  Consults: None  Significant Diagnostic Studies: none  Treatments: surgery: as above  Discharge Exam: Blood pressure (!) 167/97, pulse 96, temperature 98.4 F (36.9 C), temperature source Oral, resp. rate 16, height 5' 3 (1.6 m), weight 66 kg, SpO2 100%. Incision/Wound: c/d/i  Disposition: Discharge disposition: 01-Home or Self Care     home  Discharge Instructions     Diet - low sodium heart healthy   Complete by: As directed    Increase activity slowly   Complete by: As directed       Allergies as of 01/17/2024       Reactions   Ibuprofen Swelling   Statins Other (See Comments)   Causes moderate headaches        Medication List     TAKE these medications    calcium carbonate 500 MG chewable tablet Commonly known as: Tums Chew 3 tablets (600 mg of elemental calcium total) by mouth 3 (three) times daily.   Ferrex 150 Forte Plus 50-100 MG Caps Take 1 capsule by mouth daily.   furosemide 40 MG tablet Commonly known as: LASIX Take 40 mg by mouth daily as needed for fluid.   hyoscyamine  0.125 MG SL tablet Commonly known as: Levsin /SL Place 1 tablet (0.125 mg total) under the tongue every 4 (four) hours as needed.   iron polysaccharides 150 MG capsule Commonly known as: NIFEREX Take 150 mg by mouth daily.   Jardiance 25 MG Tabs tablet Generic drug: empagliflozin Take 25 mg by mouth daily.    lisinopril 20 MG tablet Commonly known as: ZESTRIL Take 20 mg by mouth daily.   magnesium oxide 400 MG tablet Commonly known as: MAG-OX Take 1 tablet (400 mg total) by mouth 2 (two) times daily.   metFORMIN 500 MG 24 hr tablet Commonly known as: GLUCOPHAGE-XR Take 500 mg by mouth 2 (two) times daily with a meal.   Potassium Chloride ER 20 MEQ Tbcr Take 60 mEq by mouth daily as needed (when taking furosemide).   Tiadylt ER 180 MG 24 hr capsule Generic drug: diltiazem Take 180 mg by mouth every morning.        Follow-up Information     Rubin Calamity, MD. Schedule an appointment as soon as possible for a visit in 3 week(s).   Specialty: General Surgery Why: Post op visit Contact information: [address] [place] [address] [phone]                 Signed: Calamity Rubin 01/17/2024, 9:03 AM

## 2024-01-17 NOTE — Plan of Care (Signed)

## 2024-01-18 LAB — SURGICAL PATHOLOGY

## 2024-02-07 ENCOUNTER — Encounter (HOSPITAL_BASED_OUTPATIENT_CLINIC_OR_DEPARTMENT_OTHER): Payer: Self-pay

## 2024-02-08 ENCOUNTER — Encounter (HOSPITAL_BASED_OUTPATIENT_CLINIC_OR_DEPARTMENT_OTHER): Payer: Self-pay | Admitting: Internal Medicine

## 2024-02-08 ENCOUNTER — Ambulatory Visit (INDEPENDENT_AMBULATORY_CARE_PROVIDER_SITE_OTHER): Admitting: Internal Medicine

## 2024-02-08 VITALS — BP 170/102 | HR 90 | Ht 63.0 in | Wt 146.0 lb

## 2024-02-08 DIAGNOSIS — E785 Hyperlipidemia, unspecified: Secondary | ICD-10-CM | POA: Diagnosis not present

## 2024-02-08 DIAGNOSIS — E119 Type 2 diabetes mellitus without complications: Secondary | ICD-10-CM

## 2024-02-08 DIAGNOSIS — Z9089 Acquired absence of other organs: Secondary | ICD-10-CM

## 2024-02-08 DIAGNOSIS — Z7189 Other specified counseling: Secondary | ICD-10-CM | POA: Diagnosis not present

## 2024-02-08 DIAGNOSIS — N183 Chronic kidney disease, stage 3 unspecified: Secondary | ICD-10-CM

## 2024-02-08 DIAGNOSIS — Z789 Other specified health status: Secondary | ICD-10-CM | POA: Diagnosis not present

## 2024-02-08 DIAGNOSIS — E1122 Type 2 diabetes mellitus with diabetic chronic kidney disease: Secondary | ICD-10-CM | POA: Diagnosis not present

## 2024-02-08 DIAGNOSIS — Z9889 Other specified postprocedural states: Secondary | ICD-10-CM | POA: Diagnosis not present

## 2024-02-08 DIAGNOSIS — I10 Essential (primary) hypertension: Secondary | ICD-10-CM

## 2024-02-08 DIAGNOSIS — I129 Hypertensive chronic kidney disease with stage 1 through stage 4 chronic kidney disease, or unspecified chronic kidney disease: Secondary | ICD-10-CM | POA: Diagnosis not present

## 2024-02-08 NOTE — Patient Instructions (Signed)
 Medication Instructions:  No changes today *If you need a refill on your cardiac medications before your next appointment, please call your pharmacy*  Lab Work: Your physician recommends that you have lab work today   NMR Lipoprofile and Lp(a)  If you have labs (blood work) drawn today and your tests are completely normal, you will receive your results only by: MyChart Message (if you have MyChart) OR A paper copy in the mail If you have any lab test that is abnormal or we need to change your treatment, we will call you to review the results.  Testing/Procedures: Calcium  Score CT scan - you can schedule today at checkout   Review non statin alternatives --handouts provided today

## 2024-02-08 NOTE — Progress Notes (Signed)
 LIPID CLINIC CONSULT NOTE  Chief Complaint:  Manage dyslipidemia  Primary Care Physician: Chrystal Lamarr RAMAN, MD  Primary Cardiologist:  None  HPI:  Susan Haley is a 63 y.o. female who is being seen today for the evaluation of dyslipidemia at the request of Chrystal Lamarr RAMAN, MD. is a pleasant 63 year old female with a history of high cholesterol and family history of high cholesterol.  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  She unfortunately had intolerance to 2 different statins, atorvastatin and rosuvastatin.  Her high cholesterol was concerning for possible familial hyperlipidemia.  Labs in January showed total cholesterol 254, HDL 54, triglycerides 150 and LDL 173.  Overall diet is fairly good and low in saturated fats.  She gets fairly regular exercise and is interested in new exercise options.  Blood pressure was quite elevated today however she says is very well-controlled at home but she has significant whitecoat hypertension.  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    Protein in urine     Past Surgical History:  Procedure Laterality Date   ABDOMINAL HYSTERECTOMY     BREAST LUMPECTOMY Right    BREAST REDUCTION SURGERY     CESAREAN SECTION     x 2   PARATHYROIDECTOMY Left 01/16/2024   Procedure: PARATHYROIDECTOMY;  Surgeon: Rubin Calamity, MD;  Location: [facility] OR;  Service: General;  Laterality: Left;  MINIMUM INVASIVE   REDUCTION MAMMAPLASTY Bilateral    UTERINE FIBROID SURGERY     x 2    FAMHx:  Family History  Problem Relation Age of Onset   Hypertension Mother    Diabetes Mother    Prostate cancer Father    Diabetes Father    Hypertension Father     Breast cancer Maternal Aunt    Irritable bowel syndrome Sister    Colon cancer Neg Hx    Esophageal cancer Neg Hx    Stomach cancer Neg Hx    Rectal cancer Neg Hx     SOCHx:   reports that she has never smoked. She has never used smokeless tobacco. She reports that she does not drink alcohol and does not use drugs.  ALLERGIES:  Allergies[1]  ROS: Pertinent items noted in HPI and remainder of comprehensive ROS otherwise negative.  HOME MEDS: Medications Ordered Prior to Encounter[2]  LABS/IMAGING: No results found for this or any previous visit (from the past 48 hours). No results found.  LIPID PANEL: No results found for: CHOL, TRIG, HDL, CHOLHDL, VLDL, LDLCALC, LDLDIRECT  No results found for: LIPOA   WEIGHTS: Wt Readings from Last 3 Encounters:  02/08/24 146 lb (66.2 kg)  01/16/24 145 lb 8 oz (66 kg)  01/11/24 147 lb (66.7 kg)    VITALS: BP (!) 170/102 (BP Location: Left Arm, Patient Position: Sitting, Cuff Size: Normal)   Pulse 90   Ht 5' 3 (1.6 m)   Wt 146 lb (66.2 kg)   SpO2 98%   BMI 25.86 kg/m   EXAM: Deferred  EKG: Deferred  ASSESSMENT: Dyslipidemia, goal LDL less than 70 Type 2 diabetes-A1c 7.2% Hypertension Hyperparathyroidism status postsurgery Membranous nephropathy CKD stage IIIa  PLAN: 1.   Ms. Jacinto has a dyslipidemia with a  target LDL less than 70.  She also has type 2 diabetes, hypertension and chronic kidney disease.  There is family history of high cholesterol in her mother, who also has diabetes and hypertension but no early onset heart disease.  This could be a familial hyperlipidemia.  She may also have an elevated LP(a).  I recommend repeating labs since her studies were earlier this year.  Will obtain an NMR and LP(a) today and get a calcium  score for restratification.  She noted that her blood pressure is typically high in the office but assures me that it was well-controlled at home.  Options for nonstatin's  include ezetimibe, Nexletol or PCSK9 inhibitors.  I provided her with those options today for her to research and we will reach out to her with the results of her labs and calcium  score to make a plan going forward.  Thanks again for the kind referral.  Vinie KYM Maxcy, MD, [facility], FNLA, FACP  [place]  [facility]  Medical Director of the [facility]  [facility] Diplomate of the American Board of Clinical Lipidology Attending Cardiologist  Direct Dial: [phone]  Fax: [phone]  Website:  [email or web address]  Vinie BROCKS Laekyn Rayos 02/08/2024, 8:06 AM      [1]  Allergies Allergen Reactions   Ibuprofen Swelling   Statins Other (See Comments)    Causes moderate headaches  [2]  Current Outpatient Medications on File Prior to Visit  Medication Sig Dispense Refill   calcium  carbonate (TUMS) 500 MG chewable tablet Chew 3 tablets (600 mg of elemental calcium  total) by mouth 3 (three) times daily. (Patient taking differently: Chew 3 tablets by mouth 3 (three) times daily as needed (low calcium ).) 810 tablet 3   Fe-Succ Ac-C-Thre Ac-B12-FA (FERREX 150 FORTE PLUS) 50-100 MG CAPS Take 1 capsule by mouth daily.     furosemide  (LASIX ) 40 MG tablet Take 40 mg by mouth daily as needed for fluid.      JARDIANCE  25 MG TABS tablet Take 25 mg by mouth daily.     lisinopril  (ZESTRIL ) 40 MG tablet Take 80 mg by mouth daily.     magnesium  oxide (MAG-OX) 400 MG tablet Take 1 tablet (400 mg total) by mouth 2 (two) times daily. 30 tablet 1   metFORMIN  (GLUCOPHAGE -XR) 500 MG 24 hr tablet Take 500 mg by mouth 2 (two) times daily with a meal.     Potassium Chloride  ER 20 MEQ TBCR Take 60 mEq by mouth daily as needed (when taking furosemide ).     TIADYLT  ER 180 MG 24 hr capsule Take 180 mg by mouth every morning.     No current facility-administered medications on file prior to visit.

## 2024-02-12 LAB — NMR, LIPOPROFILE
Cholesterol, Total: 233 mg/dL — ABNORMAL HIGH (ref 100–199)
HDL Particle Number: 34.1 umol/L (ref >=30.5)
HDL-C: 62 mg/dL (ref >39)
LDL Particle Number: 1525 nmol/L — ABNORMAL HIGH (ref <1000)
LDL Size: 21.1 nm (ref >20.5)
LDL-C (NIH Calc): 155 mg/dL — ABNORMAL HIGH (ref 0–99)
LP-IR Score: 50 — ABNORMAL HIGH (ref <=45)
Small LDL Particle Number: 396 nmol/L (ref <=527)
Triglycerides: 92 mg/dL (ref 0–149)

## 2024-02-12 LAB — LIPOPROTEIN A (LPA): Lipoprotein (a): 72.2 nmol/L (ref <75.0)

## 2024-02-16 ENCOUNTER — Ambulatory Visit (HOSPITAL_BASED_OUTPATIENT_CLINIC_OR_DEPARTMENT_OTHER)
Admission: RE | Admit: 2024-02-16 | Discharge: 2024-02-16 | Disposition: A | Discharge status: Home / Self Care | Payer: Self-pay | Source: Ambulatory Visit | Attending: Internal Medicine | Admitting: Internal Medicine

## 2024-02-16 DIAGNOSIS — E785 Hyperlipidemia, unspecified: Secondary | ICD-10-CM | POA: Insufficient documentation

## 2024-02-16 DIAGNOSIS — N183 Chronic kidney disease, stage 3 unspecified: Secondary | ICD-10-CM | POA: Insufficient documentation

## 2024-02-16 DIAGNOSIS — E1122 Type 2 diabetes mellitus with diabetic chronic kidney disease: Secondary | ICD-10-CM | POA: Insufficient documentation

## 2024-02-16 DIAGNOSIS — Z789 Other specified health status: Secondary | ICD-10-CM | POA: Insufficient documentation

## 2024-02-16 DIAGNOSIS — Z7189 Other specified counseling: Secondary | ICD-10-CM | POA: Insufficient documentation

## 2024-02-16 DIAGNOSIS — I129 Hypertensive chronic kidney disease with stage 1 through stage 4 chronic kidney disease, or unspecified chronic kidney disease: Secondary | ICD-10-CM | POA: Insufficient documentation

## 2024-02-16 DIAGNOSIS — I10 Essential (primary) hypertension: Secondary | ICD-10-CM | POA: Insufficient documentation

## 2024-02-16 DIAGNOSIS — E119 Type 2 diabetes mellitus without complications: Secondary | ICD-10-CM | POA: Insufficient documentation

## 2024-02-19 ENCOUNTER — Ambulatory Visit: Payer: Self-pay | Admitting: Internal Medicine

## 2024-02-27 ENCOUNTER — Other Ambulatory Visit: Payer: Self-pay | Admitting: Family Medicine

## 2024-02-27 DIAGNOSIS — Z1231 Encounter for screening mammogram for malignant neoplasm of breast: Secondary | ICD-10-CM

## 2024-02-28 ENCOUNTER — Ambulatory Visit
Admission: RE | Admit: 2024-02-28 | Discharge: 2024-02-28 | Disposition: A | Discharge status: Home / Self Care | Source: Ambulatory Visit | Attending: Family Medicine | Admitting: Family Medicine

## 2024-02-28 DIAGNOSIS — Z1231 Encounter for screening mammogram for malignant neoplasm of breast: Secondary | ICD-10-CM

## 2024-03-13 ENCOUNTER — Telehealth: Payer: Self-pay | Admitting: Internal Medicine

## 2024-03-13 DIAGNOSIS — R9389 Abnormal findings on diagnostic imaging of other specified body structures: Secondary | ICD-10-CM

## 2024-03-13 NOTE — Telephone Encounter (Signed)
 Spoke with [facility] for stat call, radiology over-read on coronary calcium  score  IMPRESSION: Soft tissue fullness in the aorticopulmonary window suspicious for adenopathy. Dedicated CT of the chest with contrast material is recommended for further evaluation.   Routed to Dr. Mona

## 2024-03-13 NOTE — Telephone Encounter (Signed)
 Is calling to report critical results. Call transferred

## 2024-03-14 NOTE — Telephone Encounter (Signed)
 Spoke with patient. Results given. Advised chest CT w/contrast will be ordered, [facility] location preferred, end of month/Monday or Friday preferred.   Message sent to scheduling team.   Report routed to PCP.

## 2024-03-14 NOTE — Telephone Encounter (Signed)
 Mona Vinie BROCKS, MD to Me  (Selected Message)     03/13/24  3:15 PM Ok to order CT chest with contrast to evaluate possible adenopathy.   Dr. Mona

## 2024-03-29 ENCOUNTER — Ambulatory Visit (HOSPITAL_COMMUNITY)
Admission: RE | Admit: 2024-03-29 | Discharge: 2024-03-29 | Disposition: A | Source: Ambulatory Visit | Attending: Internal Medicine | Admitting: Internal Medicine

## 2024-03-29 DIAGNOSIS — Z01812 Encounter for preprocedural laboratory examination: Secondary | ICD-10-CM | POA: Diagnosis present

## 2024-03-29 DIAGNOSIS — R9389 Abnormal findings on diagnostic imaging of other specified body structures: Secondary | ICD-10-CM | POA: Diagnosis present

## 2024-03-29 LAB — POCT I-STAT CREATININE: Creatinine, Ser: 0.9 mg/dL (ref 0.44–1.00)

## 2024-03-29 MED ORDER — IOHEXOL 350 MG/ML SOLN
50.0000 mL | Freq: Once | INTRAVENOUS | Status: AC | PRN
  Administered 2024-03-29: 50 mL via INTRAVENOUS

## 2024-04-05 ENCOUNTER — Ambulatory Visit: Payer: Self-pay | Admitting: Internal Medicine
